# Patient Record
Sex: Female | Born: 1955
Health system: Southern US, Community
[De-identification: ages and names within clinical notes are randomized; demographics above are authoritative.]

## PROBLEM LIST (undated history)

## (undated) DIAGNOSIS — E785 Hyperlipidemia, unspecified: Secondary | ICD-10-CM

## (undated) DIAGNOSIS — I4891 Unspecified atrial fibrillation: Secondary | ICD-10-CM

## (undated) DIAGNOSIS — Z9889 Other specified postprocedural states: Secondary | ICD-10-CM

## (undated) DIAGNOSIS — R112 Nausea with vomiting, unspecified: Secondary | ICD-10-CM

## (undated) HISTORY — PX: WRIST FRACTURE SURGERY: SHX121

## (undated) HISTORY — PX: ABDOMINAL HYSTERECTOMY: SHX81

## (undated) HISTORY — DX: Unspecified atrial fibrillation: I48.91

---
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
## 2015-10-30 DIAGNOSIS — H524 Presbyopia: Secondary | ICD-10-CM | POA: Diagnosis not present

## 2016-06-05 DIAGNOSIS — M654 Radial styloid tenosynovitis [de Quervain]: Secondary | ICD-10-CM | POA: Diagnosis not present

## 2016-07-10 DIAGNOSIS — M654 Radial styloid tenosynovitis [de Quervain]: Secondary | ICD-10-CM | POA: Diagnosis not present

## 2016-08-06 DIAGNOSIS — Z0001 Encounter for general adult medical examination with abnormal findings: Secondary | ICD-10-CM | POA: Diagnosis not present

## 2016-08-06 DIAGNOSIS — Z23 Encounter for immunization: Secondary | ICD-10-CM | POA: Diagnosis not present

## 2016-08-06 DIAGNOSIS — Z1389 Encounter for screening for other disorder: Secondary | ICD-10-CM | POA: Diagnosis not present

## 2016-08-06 DIAGNOSIS — Z1211 Encounter for screening for malignant neoplasm of colon: Secondary | ICD-10-CM | POA: Diagnosis not present

## 2016-08-28 DIAGNOSIS — Z0001 Encounter for general adult medical examination with abnormal findings: Secondary | ICD-10-CM | POA: Diagnosis not present

## 2016-09-04 DIAGNOSIS — M654 Radial styloid tenosynovitis [de Quervain]: Secondary | ICD-10-CM | POA: Diagnosis not present

## 2016-09-12 DIAGNOSIS — Z1231 Encounter for screening mammogram for malignant neoplasm of breast: Secondary | ICD-10-CM | POA: Diagnosis not present

## 2016-09-18 DIAGNOSIS — Z1211 Encounter for screening for malignant neoplasm of colon: Secondary | ICD-10-CM | POA: Diagnosis not present

## 2016-09-18 DIAGNOSIS — Z01818 Encounter for other preprocedural examination: Secondary | ICD-10-CM | POA: Diagnosis not present

## 2016-09-25 DIAGNOSIS — R928 Other abnormal and inconclusive findings on diagnostic imaging of breast: Secondary | ICD-10-CM | POA: Diagnosis not present

## 2016-09-25 DIAGNOSIS — R922 Inconclusive mammogram: Secondary | ICD-10-CM | POA: Diagnosis not present

## 2016-09-25 DIAGNOSIS — D241 Benign neoplasm of right breast: Secondary | ICD-10-CM | POA: Diagnosis not present

## 2016-09-25 DIAGNOSIS — N6489 Other specified disorders of breast: Secondary | ICD-10-CM | POA: Diagnosis not present

## 2016-09-26 DIAGNOSIS — M654 Radial styloid tenosynovitis [de Quervain]: Secondary | ICD-10-CM | POA: Diagnosis not present

## 2016-11-13 DIAGNOSIS — Z90710 Acquired absence of both cervix and uterus: Secondary | ICD-10-CM | POA: Diagnosis not present

## 2016-12-15 DIAGNOSIS — Z1211 Encounter for screening for malignant neoplasm of colon: Secondary | ICD-10-CM | POA: Diagnosis not present

## 2016-12-15 DIAGNOSIS — K648 Other hemorrhoids: Secondary | ICD-10-CM | POA: Diagnosis not present

## 2016-12-15 DIAGNOSIS — E7800 Pure hypercholesterolemia, unspecified: Secondary | ICD-10-CM | POA: Diagnosis not present

## 2016-12-15 DIAGNOSIS — Q438 Other specified congenital malformations of intestine: Secondary | ICD-10-CM | POA: Diagnosis not present

## 2016-12-15 DIAGNOSIS — K5730 Diverticulosis of large intestine without perforation or abscess without bleeding: Secondary | ICD-10-CM | POA: Diagnosis not present

## 2016-12-15 DIAGNOSIS — E785 Hyperlipidemia, unspecified: Secondary | ICD-10-CM | POA: Diagnosis not present

## 2017-01-21 ENCOUNTER — Encounter: Payer: Self-pay | Admitting: Cardiology

## 2017-01-21 DIAGNOSIS — R002 Palpitations: Secondary | ICD-10-CM | POA: Diagnosis not present

## 2017-02-13 ENCOUNTER — Ambulatory Visit: Payer: Self-pay | Admitting: Cardiology

## 2017-02-16 ENCOUNTER — Encounter: Payer: Self-pay | Admitting: Cardiology

## 2017-02-16 ENCOUNTER — Ambulatory Visit (INDEPENDENT_AMBULATORY_CARE_PROVIDER_SITE_OTHER): Payer: BLUE CROSS/BLUE SHIELD | Admitting: Cardiology

## 2017-02-16 DIAGNOSIS — R002 Palpitations: Secondary | ICD-10-CM

## 2017-02-16 NOTE — Patient Instructions (Addendum)
Medication Instructions:  Your physician recommends that you continue on your current medications as directed. Please refer to the Current Medication list given to you today.   Labwork: None  Testing/Procedures: Your physician has requested that you have a stress echocardiogram. For further information please visit [email or web address]. Please follow instruction sheet as given.  Your physician has recommended that you wear an event monitor. Event monitors are medical devices that record the heart's electrical activity. Doctors most often [place] these monitors to diagnose arrhythmias. Arrhythmias are problems with the speed or rhythm of the heartbeat. The monitor is a small, portable device. You can wear one while you do your normal daily activities. This is usually used to diagnose what is causing palpitations/syncope (passing out).    Follow-Up: Your physician recommends that you schedule a follow-up appointment in: 6 weeks.   Any Other Special Instructions Will Be Listed Below (If Applicable).     If you need a refill on your cardiac medications before your next appointment, please call your pharmacy.    [email or web address] [email or web address]        FDA-cleared, clinical grade mobile EKG monitor: Jodelle Red is the most clinically-validated mobile EKG used by the world's leading cardiac care medical professionals With Basic service, know instantly if your heart rhythm is normal or if atrial fibrillation is detected, and email the last single EKG recording to yourself or your doctor Premium service, available for purchase through the Kardia app for $9.99 per month or $99 per year, includes unlimited history and storage of your EKG recordings, a monthly EKG summary report to share with your doctor, along with the ability to track your blood pressure, activity and weight Includes one KardiaMobile phone clip FREE SHIPPING: Standard delivery 1-3 business days. Orders  placed by 11:00am PST will ship that afternoon. Otherwise, will ship next business day. All orders ship via [employer] from [place], [place], the new wearable EKG by [employer]. Vladimir Faster replaces your original Apple Watch band. The first of its kind, FDA-cleared KardiaBand provides accurate and instant analysis for detecting atrial fibrillation (AF) and normal sinus rhythm in an EKG. Simply place your thumb on the integrated KardiaBand sensor to take a medical-grade EKG in just 30 seconds. Results appear instantly on your Apple Watch. Vladimir Faster is available today for just $199. KardiaBand features are designed exclusively for use with [employer]  - $99 year. The [employer] for [employer] includes [employer]'s revolutionary SmartRhythm monitoring feature. SmartRhythm monitoring uses an intelligent neural network that runs directly on the [employer], constantly acquiring data from the watch's heart rate sensor and its accelerometer. SmartRhythm compares your heart rate to what it expects from your minute-by-minute level of activity. When the network sees a pattern of heart rate and activity that it does not expect, it notifies you to take an EKG. With Baring, peace of mind is just an EKG away.  .  1. Avoid all over-the-counter antihistamines except Claritin/Loratadine and Zyrtec/Cetrizine. 2. Avoid all combination including cold sinus allergies flu decongestant and sleep medications 3. You can use Robitussin DM Mucinex and Mucinex DM for cough. 4. can use Tylenol aspirin ibuprofen and naproxen but no combinations such as sleep or sinus.

## 2017-02-16 NOTE — Progress Notes (Signed)
Cardiology Office Note:    Date:  02/16/2017   ID:  Connie Norris, Connie Norris 1955/10/25, MRN [id number]  PCP:  [name], MD  Cardiologist:  Shirlee More, MD   Referring MD: [name], MD  ASSESSMENT:    1. Palpitation    PLAN:    In order of problems listed above:  1. Her symptoms are quite suggestive of paroxysmal atrial fibrillation. Fortunately her risk score if atrial fibrillation is documented as low at 1 and at this time I would not start her on an anticoagulant. I asked her to utilize the event monitor for a month to try to document an episode and if we do not succeed to use a Smart phone adapter to record EKG. She also undergo a stress test to exclude cardiomyopathy or coronary disease. She is given instructions to avoid over-the-counter proarrhythmic drugs.  Next appointment 6 weeks   Medication Adjustments/Labs and Tests Ordered: Current medicines are reviewed at length with the patient today.  Concerns regarding medicines are outlined above.  Orders Placed This Encounter  Procedures  . Cardiac event monitor  . ECHOCARDIOGRAM STRESS TEST   No orders of the defined types were placed in this encounter.    Chief Complaint  Patient presents with  . Palpitations    History of Present Illness:    Connie Norris is a 61 y.o. female who is being seen today for the evaluation of Episodes of profound weakness shortness of breath palpitation at the request of [name], MD. First episode was low but more than a year ago lasted 20 minutes resolved and had no recurrence until June of this year. In June she had 3 episodes.There are nonexertional in nature characterized by her heart skipping shortness of breath weakness but no chest pain TIA or syncope. She takes no over-the-counter proarrhythmic drugs. Unfortunately she has not checked her pulse or blood pressure during an episode. Otherwise she is vigorous active [place] tennis almost daily has no exercise intolerance  and exertional shortness of breath or chest pain. She was seen in Dr. Florina Ou office routine labs were checked EKG performed and advised to be seen in cardiology consultation. She has no history of congenital or rheumatic heart disease  History reviewed. No pertinent past medical history.  Past Surgical History:  Procedure Laterality Date  . ABDOMINAL HYSTERECTOMY    . CESAREAN SECTION    . WRIST FRACTURE SURGERY      Current Medications: Current Meds  Medication Sig  . TURMERIC PO Take 1 capsule by mouth daily.     Allergies:   Penicillins   Social History   Social History  . Marital status: Married    Spouse name: N/A  . Number of children: N/A  . Years of education: N/A   Social History Main Topics  . Smoking status: Former Research scientist (life sciences)  . Smokeless tobacco: Never Used  . Alcohol use Yes  . Drug use: No  . Sexual activity: Not Asked   Other Topics Concern  . None   Social History Narrative  . None     Family History: The patient's family history includes Cancer in her mother; Heart attack in her father and mother; Heart disease in her maternal grandmother.  ROS:   ROS Please see the history of present illness.     All other systems reviewed and are negative.  EKGs/Labs/Other Studies Reviewed:    The following studies were reviewed today:   EKG:  EKG is  ordered today.  The ekg  ordered today demonstrates Sinus rhythm normal.  Recent Labs: No results found for requested labs within last 8760 hours.  Recent Lipid Panel No results found for: CHOL, TRIG, HDL, CHOLHDL, VLDL, LDLCALC, LDLDIRECT  Physical Exam:    VS:  BP 122/80   Pulse 64   Resp 10   Ht 5\' 5"  (1.651 m)   Wt 150 lb 6.4 oz (68.2 kg)   BMI 25.03 kg/m     Wt Readings from Last 3 Encounters:  02/16/17 150 lb 6.4 oz (68.2 kg)     GEN:  Well nourished, well developed in no acute distress HEENT: Normal NECK: No JVD; No carotid bruits LYMPHATICS: No lymphadenopathy CARDIAC: RRR, no murmurs,  rubs, gallops RESPIRATORY:  Clear to auscultation without rales, wheezing or rhonchi  ABDOMEN: Soft, non-tender, non-distended MUSCULOSKELETAL:  No edema; No deformity  SKIN: Warm and dry NEUROLOGIC:  Alert and oriented x 3 PSYCHIATRIC:  Normal affect     Signed, Shirlee More, MD  02/16/2017 4:48 PM    [facility]

## 2017-02-17 NOTE — Addendum Note (Signed)
Addended by: Warner Mccreedy E on: 02/17/2017 04:00 PM   Modules accepted: Orders

## 2017-02-19 ENCOUNTER — Other Ambulatory Visit: Payer: Self-pay

## 2017-02-19 ENCOUNTER — Telehealth: Payer: Self-pay

## 2017-02-19 DIAGNOSIS — R002 Palpitations: Secondary | ICD-10-CM

## 2017-02-19 NOTE — Telephone Encounter (Signed)
Patient advised that insurance did not approve stress echo. Dr. Bettina Gavia advised change to echocardiogram and treadmill stress test. Patient advised of the change and that we will again send for pre-certification from insurance before scheduling.

## 2017-02-25 DIAGNOSIS — R002 Palpitations: Secondary | ICD-10-CM | POA: Diagnosis not present

## 2017-02-25 DIAGNOSIS — R0789 Other chest pain: Secondary | ICD-10-CM | POA: Diagnosis not present

## 2017-02-26 ENCOUNTER — Telehealth: Payer: Self-pay

## 2017-02-26 NOTE — Telephone Encounter (Signed)
Left message for patient to return call for echo and treadmill stress test results.

## 2017-02-26 NOTE — Telephone Encounter (Signed)
Patient returned call. Advised of normal stress test and echo results. Patient states she stopped the liver supplement and has not had any other symptoms. She would like to cancel her appointment and she will call if she has an other symptoms. Appointment was cancelled. No further questions.

## 2017-04-01 ENCOUNTER — Ambulatory Visit: Payer: BLUE CROSS/BLUE SHIELD | Admitting: Cardiology

## 2017-04-16 DIAGNOSIS — N3001 Acute cystitis with hematuria: Secondary | ICD-10-CM | POA: Diagnosis not present

## 2017-05-07 DIAGNOSIS — M2011 Hallux valgus (acquired), right foot: Secondary | ICD-10-CM | POA: Diagnosis not present

## 2017-05-07 DIAGNOSIS — M21611 Bunion of right foot: Secondary | ICD-10-CM | POA: Diagnosis not present

## 2017-05-13 ENCOUNTER — Other Ambulatory Visit: Payer: Self-pay | Admitting: Orthopedic Surgery

## 2017-05-27 ENCOUNTER — Encounter (HOSPITAL_BASED_OUTPATIENT_CLINIC_OR_DEPARTMENT_OTHER): Payer: Self-pay | Admitting: *Deleted

## 2017-06-04 ENCOUNTER — Ambulatory Visit (HOSPITAL_BASED_OUTPATIENT_CLINIC_OR_DEPARTMENT_OTHER)
Admission: RE | Admit: 2017-06-04 | Discharge: 2017-06-04 | Disposition: A | Discharge status: Home / Self Care | Payer: BLUE CROSS/BLUE SHIELD | Source: Ambulatory Visit | Attending: Orthopedic Surgery | Admitting: Orthopedic Surgery

## 2017-06-04 ENCOUNTER — Ambulatory Visit (HOSPITAL_BASED_OUTPATIENT_CLINIC_OR_DEPARTMENT_OTHER): Payer: BLUE CROSS/BLUE SHIELD | Admitting: Anesthesiology

## 2017-06-04 ENCOUNTER — Encounter (HOSPITAL_BASED_OUTPATIENT_CLINIC_OR_DEPARTMENT_OTHER)
Admission: RE | Disposition: A | Discharge status: Home / Self Care | Payer: Self-pay | Source: Ambulatory Visit | Attending: Orthopedic Surgery

## 2017-06-04 ENCOUNTER — Other Ambulatory Visit: Payer: Self-pay

## 2017-06-04 ENCOUNTER — Encounter (HOSPITAL_BASED_OUTPATIENT_CLINIC_OR_DEPARTMENT_OTHER): Payer: Self-pay | Admitting: *Deleted

## 2017-06-04 DIAGNOSIS — Z87891 Personal history of nicotine dependence: Secondary | ICD-10-CM | POA: Diagnosis not present

## 2017-06-04 DIAGNOSIS — Z8249 Family history of ischemic heart disease and other diseases of the circulatory system: Secondary | ICD-10-CM | POA: Diagnosis not present

## 2017-06-04 DIAGNOSIS — Z9889 Other specified postprocedural states: Secondary | ICD-10-CM

## 2017-06-04 DIAGNOSIS — G8918 Other acute postprocedural pain: Secondary | ICD-10-CM | POA: Diagnosis not present

## 2017-06-04 DIAGNOSIS — M21611 Bunion of right foot: Secondary | ICD-10-CM | POA: Insufficient documentation

## 2017-06-04 DIAGNOSIS — M2011 Hallux valgus (acquired), right foot: Secondary | ICD-10-CM | POA: Diagnosis not present

## 2017-06-04 DIAGNOSIS — Z90710 Acquired absence of both cervix and uterus: Secondary | ICD-10-CM | POA: Insufficient documentation

## 2017-06-04 DIAGNOSIS — Z880 Allergy status to penicillin: Secondary | ICD-10-CM | POA: Insufficient documentation

## 2017-06-04 HISTORY — PX: METATARSAL OSTEOTOMY WITH BUNIONECTOMY: SHX5662

## 2017-06-04 HISTORY — DX: Other specified postprocedural states: Z98.890

## 2017-06-04 HISTORY — DX: Other specified postprocedural states: R11.2

## 2017-06-04 HISTORY — PX: AIKEN OSTEOTOMY: SHX6331

## 2017-06-04 SURGERY — BUNIONECTOMY, WITH METATARSAL OSTEOTOMY
Anesthesia: General | Site: Foot | Laterality: Right

## 2017-06-04 MED ORDER — CEFAZOLIN SODIUM-DEXTROSE 2-4 GM/100ML-% IV SOLN
INTRAVENOUS | Status: AC
  Filled 2017-06-04: qty 100

## 2017-06-04 MED ORDER — ONDANSETRON HCL 4 MG/2ML IJ SOLN: 4.0000 mg | Freq: Once | INTRAMUSCULAR | Status: DC | PRN

## 2017-06-04 MED ORDER — FENTANYL CITRATE (PF) 100 MCG/2ML IJ SOLN
INTRAMUSCULAR | Status: AC
  Filled 2017-06-04: qty 2

## 2017-06-04 MED ORDER — HYDROMORPHONE HCL 1 MG/ML IJ SOLN
INTRAMUSCULAR | Status: AC
  Filled 2017-06-04: qty 0.5

## 2017-06-04 MED ORDER — PROPOFOL 10 MG/ML IV BOLUS
INTRAVENOUS | Status: DC | PRN
  Administered 2017-06-04: 30 mg via INTRAVENOUS
  Administered 2017-06-04: 150 mg via INTRAVENOUS
  Administered 2017-06-04: 20 mg via INTRAVENOUS

## 2017-06-04 MED ORDER — DEXAMETHASONE SODIUM PHOSPHATE 4 MG/ML IJ SOLN
INTRAMUSCULAR | Status: DC | PRN
  Administered 2017-06-04: 10 mg via INTRAVENOUS

## 2017-06-04 MED ORDER — FENTANYL CITRATE (PF) 100 MCG/2ML IJ SOLN
25.0000 ug | INTRAMUSCULAR | Status: DC | PRN
  Administered 2017-06-04 (×2): 50 ug via INTRAVENOUS

## 2017-06-04 MED ORDER — SCOPOLAMINE 1 MG/3DAYS TD PT72
MEDICATED_PATCH | TRANSDERMAL | Status: AC
  Filled 2017-06-04: qty 1

## 2017-06-04 MED ORDER — PROMETHAZINE HCL 25 MG/ML IJ SOLN: 6.2500 mg | INTRAMUSCULAR | Status: DC | PRN

## 2017-06-04 MED ORDER — MEPERIDINE HCL 25 MG/ML IJ SOLN: 6.2500 mg | INTRAMUSCULAR | Status: DC | PRN

## 2017-06-04 MED ORDER — CEFAZOLIN SODIUM-DEXTROSE 2-4 GM/100ML-% IV SOLN
2.0000 g | INTRAVENOUS | Status: AC
  Administered 2017-06-04: 2 g via INTRAVENOUS

## 2017-06-04 MED ORDER — ONDANSETRON HCL 4 MG/2ML IJ SOLN
INTRAMUSCULAR | Status: DC | PRN
  Administered 2017-06-04: 4 mg via INTRAVENOUS

## 2017-06-04 MED ORDER — LIDOCAINE HCL (CARDIAC) 20 MG/ML IV SOLN
INTRAVENOUS | Status: DC | PRN
  Administered 2017-06-04: 6 mg via INTRAVENOUS

## 2017-06-04 MED ORDER — SODIUM CHLORIDE 0.9 % IV SOLN: INTRAVENOUS | Status: DC

## 2017-06-04 MED ORDER — FENTANYL CITRATE (PF) 100 MCG/2ML IJ SOLN
25.0000 ug | INTRAMUSCULAR | Status: DC | PRN
  Administered 2017-06-04: 50 ug via INTRAVENOUS

## 2017-06-04 MED ORDER — DOCUSATE SODIUM 100 MG PO CAPS: 100.0000 mg | ORAL_CAPSULE | Freq: Two times a day (BID) | ORAL | 0 refills | Status: DC

## 2017-06-04 MED ORDER — MIDAZOLAM HCL 2 MG/2ML IJ SOLN
INTRAMUSCULAR | Status: AC
  Filled 2017-06-04: qty 2

## 2017-06-04 MED ORDER — CHLORHEXIDINE GLUCONATE 4 % EX LIQD: 60.0000 mL | Freq: Once | CUTANEOUS | Status: DC

## 2017-06-04 MED ORDER — OXYCODONE HCL 5 MG PO TABS: 5.0000 mg | ORAL_TABLET | ORAL | 0 refills | Status: DC | PRN

## 2017-06-04 MED ORDER — ROPIVACAINE HCL 7.5 MG/ML IJ SOLN
INTRAMUSCULAR | Status: DC | PRN
  Administered 2017-06-04: 20 mL via PERINEURAL

## 2017-06-04 MED ORDER — HYDROMORPHONE HCL 1 MG/ML IJ SOLN
0.5000 mg | INTRAMUSCULAR | Status: DC | PRN
  Administered 2017-06-04 (×3): 0.5 mg via INTRAVENOUS

## 2017-06-04 MED ORDER — SENNA 8.6 MG PO TABS: 2.0000 | ORAL_TABLET | Freq: Two times a day (BID) | ORAL | 0 refills | Status: DC

## 2017-06-04 MED ORDER — LACTATED RINGERS IV SOLN
INTRAVENOUS | Status: DC | PRN
  Administered 2017-06-04: 11:00:00 via INTRAVENOUS

## 2017-06-04 MED ORDER — FENTANYL CITRATE (PF) 100 MCG/2ML IJ SOLN: 100.0000 ug | Freq: Once | INTRAMUSCULAR | Status: DC

## 2017-06-04 MED ORDER — MIDAZOLAM HCL 2 MG/2ML IJ SOLN: 2.0000 mg | Freq: Once | INTRAMUSCULAR | Status: DC

## 2017-06-04 SURGICAL SUPPLY — 71 items
BANDAGE ESMARK 6X9 LF (GAUZE/BANDAGES/DRESSINGS) IMPLANT
BIT DRILL 1.5X30 QC DISP (BIT) ×2 IMPLANT
BIT DRILL 2.0 (BIT) ×1
BIT DRILL 2XNS DISP SS SM FRAG (BIT) ×1 IMPLANT
BIT DRL 2XNS DISP SS SM FRAG (BIT) ×1
BLADE AVERAGE 25X9 (BLADE) ×2 IMPLANT
BLADE LONG MED 25X9 (BLADE) IMPLANT
BLADE MICRO SAGITTAL (BLADE) IMPLANT
BLADE SURG 15 STRL LF DISP TIS (BLADE) ×3 IMPLANT
BLADE SURG 15 STRL SS (BLADE) ×3
BNDG COHESIVE 4X5 TAN STRL (GAUZE/BANDAGES/DRESSINGS) ×2 IMPLANT
BNDG COHESIVE 6X5 TAN STRL LF (GAUZE/BANDAGES/DRESSINGS) IMPLANT
BNDG CONFORM 3 STRL LF (GAUZE/BANDAGES/DRESSINGS) ×2 IMPLANT
BNDG ESMARK 6X9 LF (GAUZE/BANDAGES/DRESSINGS)
CHLORAPREP W/TINT 26ML (MISCELLANEOUS) ×2 IMPLANT
COVER BACK TABLE 60X90IN (DRAPES) ×4 IMPLANT
COVER MAYO STAND STRL (DRAPES) ×2 IMPLANT
CUFF TOURNIQUET SINGLE 24IN (TOURNIQUET CUFF) IMPLANT
CUFF TOURNIQUET SINGLE 34IN LL (TOURNIQUET CUFF) ×2 IMPLANT
DRAPE EXTREMITY T 121X128X90 (DRAPE) ×2 IMPLANT
DRAPE OEC MINIVIEW 54X84 (DRAPES) ×2 IMPLANT
DRAPE U-SHAPE 47X51 STRL (DRAPES) ×2 IMPLANT
DRSG MEPITEL 4X7.2 (GAUZE/BANDAGES/DRESSINGS) ×2 IMPLANT
DRSG PAD ABDOMINAL 8X10 ST (GAUZE/BANDAGES/DRESSINGS) ×2 IMPLANT
ELECT REM PT RETURN 9FT ADLT (ELECTROSURGICAL) ×2
ELECTRODE REM PT RTRN 9FT ADLT (ELECTROSURGICAL) ×1 IMPLANT
GAUZE SPONGE 4X4 12PLY STRL (GAUZE/BANDAGES/DRESSINGS) ×2 IMPLANT
GLOVE BIO SURGEON STRL SZ8 (GLOVE) ×2 IMPLANT
GLOVE BIOGEL PI IND STRL 7.0 (GLOVE) ×1 IMPLANT
GLOVE BIOGEL PI IND STRL 8 (GLOVE) ×2 IMPLANT
GLOVE BIOGEL PI INDICATOR 7.0 (GLOVE) ×1
GLOVE BIOGEL PI INDICATOR 8 (GLOVE) ×2
GLOVE ECLIPSE 6.5 STRL STRAW (GLOVE) ×2 IMPLANT
GLOVE ECLIPSE 8.0 STRL XLNG CF (GLOVE) ×2 IMPLANT
GOWN STRL REUS W/ TWL LRG LVL3 (GOWN DISPOSABLE) ×1 IMPLANT
GOWN STRL REUS W/ TWL XL LVL3 (GOWN DISPOSABLE) ×2 IMPLANT
GOWN STRL REUS W/TWL LRG LVL3 (GOWN DISPOSABLE) ×1
GOWN STRL REUS W/TWL XL LVL3 (GOWN DISPOSABLE) ×2
GUIDEWIRE .08 (WIRE) IMPLANT
K-WIRE .054X4 (WIRE) ×2 IMPLANT
NEEDLE HYPO 25X1 1.5 SAFETY (NEEDLE) IMPLANT
NS IRRIG 1000ML POUR BTL (IV SOLUTION) ×2 IMPLANT
PACK BASIN DAY SURGERY FS (CUSTOM PROCEDURE TRAY) ×2 IMPLANT
PAD CAST 4YDX4 CTTN HI CHSV (CAST SUPPLIES) ×1 IMPLANT
PADDING CAST ABS 4INX4YD NS (CAST SUPPLIES)
PADDING CAST ABS COTTON 4X4 ST (CAST SUPPLIES) IMPLANT
PADDING CAST COTTON 4X4 STRL (CAST SUPPLIES) ×1
PENCIL BUTTON HOLSTER BLD 10FT (ELECTRODE) ×2 IMPLANT
SANITIZER HAND PURELL 535ML FO (MISCELLANEOUS) ×2 IMPLANT
SCREW 2.0 CORTICAL FT 20MM (Screw) ×2 IMPLANT
SCREW 2.0MM CORTICAL FT 28MM (Screw) ×2 IMPLANT
SCREW CORTICAL 2.0X16 (Screw) ×2 IMPLANT
SHEET MEDIUM DRAPE 40X70 STRL (DRAPES) ×2 IMPLANT
SLEEVE SCD COMPRESS KNEE MED (MISCELLANEOUS) ×2 IMPLANT
SPONGE LAP 18X18 X RAY DECT (DISPOSABLE) ×2 IMPLANT
STOCKINETTE 6  STRL (DRAPES) ×1
STOCKINETTE 6 STRL (DRAPES) ×1 IMPLANT
SUCTION FRAZIER HANDLE 10FR (MISCELLANEOUS)
SUCTION TUBE FRAZIER 10FR DISP (MISCELLANEOUS) IMPLANT
SUT ETHIBOND 3-0 V-5 (SUTURE) IMPLANT
SUT ETHILON 3 0 PS 1 (SUTURE) ×4 IMPLANT
SUT MNCRL AB 3-0 PS2 18 (SUTURE) ×2 IMPLANT
SUT VIC AB 0 SH 27 (SUTURE) IMPLANT
SUT VIC AB 2-0 SH 27 (SUTURE) ×1
SUT VIC AB 2-0 SH 27XBRD (SUTURE) ×1 IMPLANT
SUT VICRYL 0 UR6 27IN ABS (SUTURE) IMPLANT
SYR BULB 3OZ (MISCELLANEOUS) ×2 IMPLANT
SYR CONTROL 10ML LL (SYRINGE) IMPLANT
TOWEL OR 17X24 6PK STRL BLUE (TOWEL DISPOSABLE) ×2 IMPLANT
TUBE CONNECTING 20X1/4 (TUBING) IMPLANT
UNDERPAD 30X30 (UNDERPADS AND DIAPERS) IMPLANT

## 2017-06-04 NOTE — Progress Notes (Signed)
Pt c/o pain when placing foot on 2 pillows. Pt kept her eyes shut and hands together in front of her face. Stated pain is 2/10 . Pt later c/o nausea - given peppermint oil - said it helped. Pt just kept eyes shut when responding to questions I asked her.

## 2017-06-04 NOTE — Progress Notes (Signed)
AssistedDr. Oddono with right, ultrasound guided, adductor canal block. Side rails up, monitors on throughout procedure. See vital signs in flow sheet. Tolerated Procedure well.  

## 2017-06-04 NOTE — H&P (Signed)
Connie Norris is an 61 y.o. female.   Chief Complaint: right foot pain HPI:  61 y/o female with chronic right forefoot pain from a bunion deformity.  She has failed non op treatment and presents today for right 1st MT scarf osteotomy and modified McBride bunionectomy and possibly an Akin osteotomy.  Past Medical History:  Diagnosis Date  . PONV (postoperative nausea and vomiting)     Past Surgical History:  Procedure Laterality Date  . ABDOMINAL HYSTERECTOMY    . CESAREAN SECTION    . WRIST FRACTURE SURGERY     1999 left wrist    Family History  Problem Relation Age of Onset  . Cancer Mother   . Heart attack Mother   . Heart attack Father   . Heart disease Maternal Grandmother        Pacemaker   Social History:  reports that she quit smoking about 10 years ago. she has never used smokeless tobacco. She reports that she drinks about 0.6 oz of alcohol per week. She reports that she does not use drugs.  Allergies:  Allergies  Allergen Reactions  . Penicillins     No medications prior to admission.    No results found for this or any previous visit (from the past 48 hour(s)). No results found.  ROS  No recent f/c/n/v/wt loss.  Height 5\' 5"  (1.651 m), weight 68.9 kg (152 lb). Physical Exam  wn wd woman in nad.  A and o x 4.  Mood and affect normal.  EOMI.  resp unlabored.  R foot with moderate bunion.  SKin healthy and intact.  No lymphadenopathy.  5/5 strength in PF and DF of the toes.  Sens to LT intact at the forefoot.  Pulses are palpable.  Assessment/Plan R bunion - to OR for surgical correction. The risks and benefits of the alternative treatment options have been discussed in detail.  The patient wishes to proceed with surgery and specifically understands risks of bleeding, infection, nerve damage, blood clots, need for additional surgery, amputation and death.   [name], MD 2017/06/14, 7:46 AM

## 2017-06-04 NOTE — Anesthesia Preprocedure Evaluation (Signed)
Anesthesia Evaluation  Patient identified by MRN, date of birth, ID band Patient awake    Reviewed: Allergy & Precautions, NPO status , Patient's Chart, lab work & pertinent test results  History of Anesthesia Complications (+) PONV and history of anesthetic complications  Airway Mallampati: II  TM Distance: >3 FB Neck ROM: Full    Dental  (+) Teeth Intact, Dental Advisory Given   Pulmonary former smoker,    Pulmonary exam normal breath sounds clear to auscultation       Cardiovascular negative cardio ROS Normal cardiovascular exam Rhythm:Regular Rate:Normal     Neuro/Psych negative neurological ROS  negative psych ROS   GI/Hepatic negative GI ROS, Neg liver ROS,   Endo/Other  negative endocrine ROS  Renal/GU negative Renal ROS     Musculoskeletal negative musculoskeletal ROS (+)   Abdominal   Peds  Hematology negative hematology ROS (+)   Anesthesia Other Findings Day of surgery medications reviewed with the patient.  Reproductive/Obstetrics                             Anesthesia Physical Anesthesia Plan  ASA: I  Anesthesia Plan: General   Post-op Pain Management:  Regional for Post-op pain   Induction: Intravenous  PONV Risk Score and Plan: 4 or greater and Scopolamine patch - Pre-op, Midazolam, Dexamethasone and Ondansetron  Airway Management Planned: LMA  Additional Equipment:   Intra-op Plan:   Post-operative Plan: Extubation in OR  Informed Consent: I have reviewed the patients History and Physical, chart, labs and discussed the procedure including the risks, benefits and alternatives for the proposed anesthesia with the patient or authorized representative who has indicated his/her understanding and acceptance.   Dental advisory given  Plan Discussed with: CRNA  Anesthesia Plan Comments: (Risks/benefits of general anesthesia discussed with patient including risk  of damage to teeth, lips, gum, and tongue, nausea/vomiting, allergic reactions to medications, and the possibility of heart attack, stroke and death.  All patient questions answered.  Patient wishes to proceed.)        Anesthesia Quick Evaluation

## 2017-06-04 NOTE — Transfer of Care (Signed)
Immediate Anesthesia Transfer of Care Note  Patient: [name]  Procedure(s) Performed: Right 1st metatarsal scarf osteotomy, modified McBride bunionectomy and  Akin osteotomy (Right Foot) AIKEN OSTEOTOMY (Right Foot)  Patient Location: PACU  Anesthesia Type:GA combined with regional for post-op pain  Level of Consciousness: awake, sedated and patient cooperative  Airway & Oxygen Therapy: Patient Spontanous Breathing and Patient connected to face mask oxygen  Post-op Assessment: Report given to RN and Post -op Vital signs reviewed and stable  Post vital signs: Reviewed and stable  Last Vitals:  Vitals:   06/04/17 1029 06/04/17 1030  BP:  108/65  Pulse: (!) 59 60  Resp: 13 16  Temp:    SpO2: 99% 98%    Last Pain:  Vitals:   06/04/17 0949  TempSrc: Oral         Complications: No apparent anesthesia complications

## 2017-06-04 NOTE — Anesthesia Procedure Notes (Signed)
Anesthesia Regional Block: Adductor canal block   Pre-Anesthetic Checklist: ,, timeout performed, Correct Patient, Correct Site, Correct Laterality, Correct Procedure, Correct Position, site marked, Risks and benefits discussed,  Surgical consent,  Pre-op evaluation,  At surgeon's request and post-op pain management  Laterality: Right  Prep: chloraprep       Needles:  Injection technique: Single-shot  Needle Type: Echogenic Stimulator Needle     Needle Length: 5cm  Needle Gauge: 22     Additional Needles:   Procedures:, nerve stimulator,,, ultrasound used (permanent image in chart),,,,  Narrative:  Start time: 06/04/2017 10:15 AM End time: 06/04/2017 10:20 AM Injection made incrementally with aspirations every 5 mL.  Performed by: Personally  Anesthesiologist: Janeece Riggers, MD  Additional Notes: Functioning IV was confirmed and monitors were applied.  A 80mm 22ga Arrow echogenic stimulator needle was used. Sterile prep and drape,hand hygiene and sterile gloves were used. Ultrasound guidance: relevant anatomy identified, needle position confirmed, local anesthetic spread visualized around nerve(s)., vascular puncture avoided.  Image printed for medical record. Negative aspiration and negative test dose prior to incremental administration of local anesthetic. The patient tolerated the procedure well.

## 2017-06-04 NOTE — Anesthesia Postprocedure Evaluation (Signed)
Anesthesia Post Note  Patient: Connie Norris  Procedure(s) Performed: Right 1st metatarsal scarf osteotomy, modified McBride bunionectomy and  Akin osteotomy (Right Foot) AIKEN OSTEOTOMY (Right Foot)     Patient location during evaluation: PACU Anesthesia Type: General Level of consciousness: awake and alert Pain management: pain level controlled Vital Signs Assessment: post-procedure vital signs reviewed and stable Respiratory status: spontaneous breathing, nonlabored ventilation, respiratory function stable and patient connected to nasal cannula oxygen Cardiovascular status: blood pressure returned to baseline and stable Postop Assessment: no apparent nausea or vomiting Anesthetic complications: no    Last Vitals:  Vitals:   06/04/17 1215 06/04/17 1230  BP: (!) 156/96 (!) 147/73  Pulse: 75 70  Resp: 16 13  Temp:    SpO2: 100% 97%    Last Pain:  Vitals:   06/04/17 1215  TempSrc:   PainSc: 10-Worst pain ever                 Johndaniel Catlin

## 2017-06-04 NOTE — Anesthesia Procedure Notes (Signed)
Procedure Name: LMA Insertion Date/Time: 06/04/2017 10:58 AM Performed by: Lyndee Leo, CRNA Pre-anesthesia Checklist: Patient identified, Emergency Drugs available, Suction available and Patient being monitored Patient Re-evaluated:Patient Re-evaluated prior to induction Oxygen Delivery Method: Circle system utilized Preoxygenation: Pre-oxygenation with 100% oxygen Induction Type: IV induction Ventilation: Mask ventilation without difficulty LMA: LMA inserted LMA Size: 4.0 Number of attempts: 1 Airway Equipment and Method: Bite block Placement Confirmation: positive ETCO2 Tube secured with: Tape Dental Injury: Teeth and Oropharynx as per pre-operative assessment

## 2017-06-04 NOTE — Op Note (Signed)
06/04/2017  11:52 AM  PATIENT:  Connie Norris  61 y.o. female  PRE-OPERATIVE DIAGNOSIS:  Right bunion  POST-OPERATIVE DIAGNOSIS:  Right bunion  Procedure(s): 1.  Right modified McBride bunionectomy   2.  Right 1st MT scarf osteotomy   3.  Right hallux proximal phalanx Akin osteotomy   4.  Right foot AP and lateral xrays  SURGEON:  [name], MD  ASSISTANT: Mechele Claude, PA-C  ANESTHESIA:   General, regional  EBL:  minimal   TOURNIQUET:  42 min at 627 mm Hg  COMPLICATIONS:  None apparent  DISPOSITION:  Extubated, awake and stable to recovery.  INDICATION FOR PROCEDURE: The patient is a 61 year old woman who has a long history of a painful right foot bunion deformity.  She has failed nonoperative treatment to date including activity modification, oral anti-inflammatories and shoewear modification.  She presents today for operative treatment of this painful deformity.  The risks and benefits of the alternative treatment options have been discussed in detail.  The patient wishes to proceed with surgery and specifically understands risks of bleeding, infection, nerve damage, blood clots, need for additional surgery, amputation and death.  PROCEDURE IN DETAIL:  After pre operative consent was obtained, and the correct operative site was identified, the patient was brought to the operating room and placed supine on the OR table.  Anesthesia was administered.  Pre-operative antibiotics were administered.  A surgical timeout was taken.  The right lower extremity was prepped and draped in standard sterile fashion with a tourniquet around the thigh.  The extremity was exsanguinated and the tourniquet was inflated to 250 mmHg.  A longitudinal incision was made over the dorsum of the first webspace.  Dissection was carried down through the subtendinous tissues.  The intermetatarsal ligament was divided under direct vision.  An arthrotomy was then made between the lateral sesamoid and the  metatarsal head.  Small perforations were made in the lateral joint capsule.  The hallux could then be positioned in 20 degrees of varus passively.  Attention was then turned to the medial forefoot where a longitudinal incision was made over the medial eminence.  Dissection was carried down through the subtendinous tissues in the medial joint capsule was incised and elevated plantarly and dorsally.  Subperiosteal dissection was then carried along the first metatarsal medially.  The medial eminence was then resected in line with the first metatarsal shaft.  A scarf osteotomy was then cut in the first metatarsal shaft after marking the corners with K wires.  A small wedge of bone was removed proximally.  The metatarsal head was translated laterally to correct the intermetatarsal and hallux valgus angles.  The osteotomy was held with a tenaculum.  The 2 mm screws were then inserted into the head and into the shaft proximally in bicortical fashion.  AP and lateral radiographs confirmed appropriate correction of the hallux valgus and intermetatarsal angles.  The overhanging bone was then trimmed medially with the oscillating saw.  The patient was noted to have a bit of residual hallux valgus interphalangeus.  The decision was made to proceed with an [facility] osteotomy of the proximal phalanx.  Subperiosteal dissection was carried plantarly and dorsally.  A closing wedge osteotomy was then cut in the base of the proximal phalanx.  The osteotomy was closed down and fixed with a 2 mm screw inserted in lag fashion.  Final AP and lateral radiographs confirmed appropriate position and length of all hardware in appropriate correction of the hallux valgus and intermetatarsal  angles.  The wound was irrigated copiously.  Medial joint capsule was then repaired with number cutting sutures of 2-0 Vicryl.  Subtendinous tissues were approximated with 3-0 Monocryl.  Skin incision was closed with 3-0 nylon.  Sterile dressings were  applied followed by a bunion wrap.  Tourniquet was released after application of the dressings at 42 minutes.  The patient was awakened from anesthesia and transported to the recovery room in stable condition.   FOLLOW UP PLAN: Weightbearing as tolerated on the heel in a Darco wedge style shoe.  Follow-up with me in the office in 2 weeks for suture removal and conversion to a toe spacer.   RADIOGRAPHS: AP and lateral radiographs of the right foot show interval osteotomy of the first metatarsal and proximal phalanx of the hallux.  Hardware is appropriately positioned and of the appropriate lengths.  Intermetatarsal and hallux valgus angles have been appropriately corrected.    Mechele Claude PA-C was present and scrubbed for the duration of the operative case. His assistance assistance was essential in positioning the patient, prepping and draping, gaining maintaining exposure, performing the operation, closing and dressing the wounds and applying the splint.

## 2017-06-04 NOTE — Discharge Instructions (Addendum)
Connie Simmer, MD [facility]  Please read the following information regarding your care after surgery.  Medications  You only need a prescription for the narcotic pain medicine (ex. oxycodone, Percocet, Norco).  All of the other medicines listed below are available over the counter. X Aleve 2 pills twice a day for the first 3 days after surgery. X acetominophen (Tylenol) 650 mg every 4-6 hours as you need for minor to moderate pain X oxycodone as prescribed for severe pain  Narcotic pain medicine (ex. oxycodone, Percocet, Vicodin) will cause constipation.  To prevent this problem, take the following medicines while you are taking any pain medicine. X docusate sodium (Colace) 100 mg twice a day X senna (Senokot) 2 tablets twice a day     Post Anesthesia Home Care Instructions  Activity: Get plenty of rest for the remainder of the day. A responsible individual must stay with you for 24 hours following the procedure.  For the next 24 hours, DO NOT: -Drive a car -Paediatric nurse -Drink alcoholic beverages -Take any medication unless instructed by your physician -Make any legal decisions or sign important papers.  Meals: Start with liquid foods such as gelatin or soup. Progress to regular foods as tolerated. Avoid greasy, spicy, heavy foods. If nausea and/or vomiting occur, drink only clear liquids until the nausea and/or vomiting subsides. Call your physician if vomiting continues.  Special Instructions/Symptoms: Your throat may feel dry or sore from the anesthesia or the breathing tube placed in your throat during surgery. If this causes discomfort, gargle with warm salt water. The discomfort should disappear within 24 hours.  If you had a scopolamine patch placed behind your ear for the management of post- operative nausea and/or vomiting:  1. The medication in the patch is effective for 72 hours, after which it should be removed.  Wrap patch in a tissue and discard in  the trash. Wash hands thoroughly with soap and water. 2. You may remove the patch earlier than 72 hours if you experience unpleasant side effects which may include dry mouth, dizziness or visual disturbances. 3. Avoid touching the patch. Wash your hands with soap and water after contact with the patch.      Weight Bearing X Bear weight only on your operated foot in the post-op shoe on your heel.   Cast / Splint / Dressing X Keep your splint, cast or dressing clean and dry.  Dont put anything (coat hanger, pencil, etc) down inside of it.  If it gets damp, use a hair dryer on the cool setting to dry it.  If it gets soaked, call the office to schedule an appointment for a cast/dressing change.  After your dressing, cast or splint is removed; you may shower, but do not soak or scrub the wound.  Allow the water to run over it, and then gently pat it dry.  Swelling It is normal for you to have swelling where you had surgery.  To reduce swelling and pain, keep your toes above your nose for at least 3 days after surgery.  It may be necessary to keep your foot or leg elevated for several weeks.  If it hurts, it should be elevated.  Follow Up Call my office at [phone] when you are discharged from the hospital or surgery center to schedule an appointment to be seen two weeks after surgery.  Call my office at [phone] if you develop a fever >101.5 F, nausea, vomiting, bleeding from the surgical site or severe pain.  Regional Anesthesia Blocks  1. Numbness or the inability to move the "blocked" extremity may last from 3-48 hours after placement. The length of time depends on the medication injected and your individual response to the medication. If the numbness is not going away after 48 hours, call your surgeon.  2. The extremity that is blocked will need to be protected until the numbness is gone and the  Strength has returned. Because you cannot feel it, you will need to take extra  care to avoid injury. Because it may be weak, you may have difficulty moving it or using it. You may not know what position it is in without looking at it while the block is in effect.  3. For blocks in the legs and feet, returning to weight bearing and walking needs to be done carefully. You will need to wait until the numbness is entirely gone and the strength has returned. You should be able to move your leg and foot normally before you try and bear weight or walk. You will need someone to be with you when you first try to ensure you do not fall and possibly risk injury.  4. Bruising and tenderness at the needle site are common side effects and will resolve in a few days.  5. Persistent numbness or new problems with movement should be communicated to the surgeon or the [facility] [phone] [place] [phone]).

## 2017-06-05 ENCOUNTER — Encounter (HOSPITAL_BASED_OUTPATIENT_CLINIC_OR_DEPARTMENT_OTHER): Payer: Self-pay | Admitting: Orthopedic Surgery

## 2017-06-08 DIAGNOSIS — M2011 Hallux valgus (acquired), right foot: Secondary | ICD-10-CM | POA: Diagnosis not present

## 2017-06-09 ENCOUNTER — Encounter (HOSPITAL_BASED_OUTPATIENT_CLINIC_OR_DEPARTMENT_OTHER): Payer: Self-pay | Admitting: Orthopedic Surgery

## 2017-07-22 DIAGNOSIS — M79671 Pain in right foot: Secondary | ICD-10-CM | POA: Diagnosis not present

## 2017-07-27 DIAGNOSIS — M2010 Hallux valgus (acquired), unspecified foot: Secondary | ICD-10-CM | POA: Insufficient documentation

## 2017-08-10 DIAGNOSIS — J111 Influenza due to unidentified influenza virus with other respiratory manifestations: Secondary | ICD-10-CM | POA: Diagnosis not present

## 2017-08-19 DIAGNOSIS — Z0001 Encounter for general adult medical examination with abnormal findings: Secondary | ICD-10-CM | POA: Diagnosis not present

## 2017-08-19 DIAGNOSIS — N952 Postmenopausal atrophic vaginitis: Secondary | ICD-10-CM | POA: Diagnosis not present

## 2017-08-19 DIAGNOSIS — Z1389 Encounter for screening for other disorder: Secondary | ICD-10-CM | POA: Diagnosis not present

## 2017-08-19 DIAGNOSIS — Z90710 Acquired absence of both cervix and uterus: Secondary | ICD-10-CM | POA: Diagnosis not present

## 2017-08-19 DIAGNOSIS — E7800 Pure hypercholesterolemia, unspecified: Secondary | ICD-10-CM | POA: Diagnosis not present

## 2017-08-19 DIAGNOSIS — Z0000 Encounter for general adult medical examination without abnormal findings: Secondary | ICD-10-CM | POA: Diagnosis not present

## 2017-08-20 DIAGNOSIS — M79671 Pain in right foot: Secondary | ICD-10-CM | POA: Diagnosis not present

## 2017-09-15 DIAGNOSIS — Z1231 Encounter for screening mammogram for malignant neoplasm of breast: Secondary | ICD-10-CM | POA: Diagnosis not present

## 2017-10-07 DIAGNOSIS — R928 Other abnormal and inconclusive findings on diagnostic imaging of breast: Secondary | ICD-10-CM | POA: Diagnosis not present

## 2017-11-05 DIAGNOSIS — H25813 Combined forms of age-related cataract, bilateral: Secondary | ICD-10-CM | POA: Diagnosis not present

## 2018-06-08 DIAGNOSIS — R05 Cough: Secondary | ICD-10-CM | POA: Diagnosis not present

## 2018-09-02 DIAGNOSIS — Z23 Encounter for immunization: Secondary | ICD-10-CM | POA: Diagnosis not present

## 2018-09-02 DIAGNOSIS — Z90710 Acquired absence of both cervix and uterus: Secondary | ICD-10-CM | POA: Diagnosis not present

## 2018-09-02 DIAGNOSIS — Z1389 Encounter for screening for other disorder: Secondary | ICD-10-CM | POA: Diagnosis not present

## 2018-09-02 DIAGNOSIS — Z0000 Encounter for general adult medical examination without abnormal findings: Secondary | ICD-10-CM | POA: Diagnosis not present

## 2018-11-10 DIAGNOSIS — M7541 Impingement syndrome of right shoulder: Secondary | ICD-10-CM | POA: Diagnosis not present

## 2018-11-10 DIAGNOSIS — M25511 Pain in right shoulder: Secondary | ICD-10-CM | POA: Diagnosis not present

## 2018-11-22 DIAGNOSIS — Z23 Encounter for immunization: Secondary | ICD-10-CM | POA: Diagnosis not present

## 2018-12-13 ENCOUNTER — Telehealth: Payer: Self-pay | Admitting: Cardiology

## 2018-12-13 NOTE — Telephone Encounter (Signed)
Virtual Visit Pre-Appointment Phone Call  "(Name), I am calling you today to discuss your upcoming appointment. We are currently trying to limit exposure to the virus that causes COVID-19 by seeing patients at home rather than in the office."  1. "What is the BEST phone number to call the day of the visit?" - include this in appointment notes  2. Do you have or have access to (through a family member/friend) a smartphone with video capability that we can use for your visit?" a. If yes - list this number in appt notes as cell (if different from BEST phone #) and list the appointment type as a VIDEO visit in appointment notes b. If no - list the appointment type as a PHONE visit in appointment notes  3. Confirm consent - "In the setting of the current Covid19 crisis, you are scheduled for a (phone or video) visit with your provider on (date) at (time).  Just as we do with many in-office visits, in order for you to participate in this visit, we must obtain consent.  If you'd like, I can send this to your mychart (if signed up) or email for you to review.  Otherwise, I can obtain your verbal consent now.  All virtual visits are billed to your insurance company just like a normal visit would be.  By agreeing to a virtual visit, we'd like you to understand that the technology does not allow for your provider to perform an examination, and thus may limit your provider's ability to fully assess your condition. If your provider identifies any concerns that need to be evaluated in person, we will make arrangements to do so.  Finally, though the technology is pretty good, we cannot assure that it will always work on either your or our end, and in the setting of a video visit, we may have to convert it to a phone-only visit.  In either situation, we cannot ensure that we have a secure connection.  Are you willing to proceed?" STAFF: Did the patient verbally acknowledge consent to telehealth visit? Document  YES/NO here: Yes  4. Advise patient to be prepared - "Two hours prior to your appointment, go ahead and check your blood pressure, pulse, oxygen saturation, and your weight (if you have the equipment to check those) and write them all down. When your visit starts, your provider will ask you for this information. If you have an Apple Watch or Kardia device, please plan to have heart rate information ready on the day of your appointment. Please have a pen and paper handy nearby the day of the visit as well."  5. Give patient instructions for MyChart download to smartphone OR Doximity/Doxy.me as below if video visit (depending on what platform provider is using)  6. Inform patient they will receive a phone call 15 minutes prior to their appointment time (may be from unknown caller ID) so they should be prepared to answer    TELEPHONE CALL NOTE  Connie Norris has been deemed a candidate for a follow-up tele-health visit to limit community exposure during the Covid-19 pandemic. I spoke with the patient via phone to ensure availability of phone/video source, confirm preferred email & phone number, and discuss instructions and expectations.  I reminded Connie Norris to be prepared with any vital sign and/or heart rhythm information that could potentially be obtained via home monitoring, at the time of her visit. I reminded Connie Norris to expect a phone call prior to her visit.  Calla Kicks 12/13/2018 4:33 PM   INSTRUCTIONS FOR DOWNLOADING THE MYCHART APP TO SMARTPHONE  - The patient must first make sure to have activated MyChart and know their login information - If Apple, go to [employer] and type in MyChart in the search bar and download the app. If Android, ask patient to go to [employer] and type in [place] in the search bar and download the app. The app is free but as with any other app downloads, their phone may require them to verify saved payment information or Apple/Android  password.  - The patient will need to then log into the app with their MyChart username and password, and select [facility] as their healthcare provider to link the account. When it is time for your visit, go to the MyChart app, find appointments, and click Begin Video Visit. Be sure to Select Allow for your device to access the Microphone and Camera for your visit. You will then be connected, and your provider will be with you shortly.  **If they have any issues connecting, or need assistance please contact MyChart service desk [phone])**  **If using a computer, in order to ensure the best quality for their visit they will need to use either of the following Internet Browsers: [employer], or Google Chrome**  IF USING DOXIMITY or DOXY.ME - The patient will receive a link just prior to their visit by text.     FULL LENGTH CONSENT FOR TELE-HEALTH VISIT   I hereby voluntarily request, consent and authorize [facility] and its employed or contracted physicians, physician assistants, nurse practitioners or other licensed health care professionals (the Practitioner), to provide me with telemedicine health care services (the Services") as deemed necessary by the treating Practitioner. I acknowledge and consent to receive the Services by the Practitioner via telemedicine. I understand that the telemedicine visit will involve communicating with the Practitioner through live audiovisual communication technology and the disclosure of certain medical information by electronic transmission. I acknowledge that I have been given the opportunity to request an in-person assessment or other available alternative prior to the telemedicine visit and am voluntarily participating in the telemedicine visit.  I understand that I have the right to withhold or withdraw my consent to the use of telemedicine in the course of my care at any time, without affecting my right to future care or treatment,  and that the Practitioner or I may terminate the telemedicine visit at any time. I understand that I have the right to inspect all information obtained and/or recorded in the course of the telemedicine visit and may receive copies of available information for a reasonable fee.  I understand that some of the potential risks of receiving the Services via telemedicine include:   Delay or interruption in medical evaluation due to technological equipment failure or disruption;  Information transmitted may not be sufficient (e.g. poor resolution of images) to allow for appropriate medical decision making by the Practitioner; and/or   In rare instances, security protocols could fail, causing a breach of personal health information.  Furthermore, I acknowledge that it is my responsibility to provide information about my medical history, conditions and care that is complete and accurate to the best of my ability. I acknowledge that Practitioner's advice, recommendations, and/or decision may be based on factors not within their control, such as incomplete or inaccurate data provided by me or distortions of diagnostic images or specimens that may result from electronic transmissions. I understand that the  practice of medicine is not an Chief Strategy Officer and that Practitioner makes no warranties or guarantees regarding treatment outcomes. I acknowledge that I will receive a copy of this consent concurrently upon execution via email to the email address I last provided but may also request a printed copy by calling the office of [facility].    I understand that my insurance will be billed for this visit.   I have read or had this consent read to me.  I understand the contents of this consent, which adequately explains the benefits and risks of the Services being provided via telemedicine.   I have been provided ample opportunity to ask questions regarding this consent and the Services and have had my questions  answered to my satisfaction.  I give my informed consent for the services to be provided through the use of telemedicine in my medical care  By participating in this telemedicine visit I agree to the above.

## 2018-12-14 ENCOUNTER — Encounter: Payer: Self-pay | Admitting: Cardiology

## 2018-12-14 ENCOUNTER — Encounter: Payer: Self-pay | Admitting: *Deleted

## 2018-12-14 DIAGNOSIS — E785 Hyperlipidemia, unspecified: Secondary | ICD-10-CM | POA: Insufficient documentation

## 2018-12-14 DIAGNOSIS — Z8679 Personal history of other diseases of the circulatory system: Secondary | ICD-10-CM | POA: Insufficient documentation

## 2018-12-14 HISTORY — DX: Hyperlipidemia, unspecified: E78.5

## 2018-12-14 NOTE — Progress Notes (Signed)
Virtual Visit via Video Note   This visit type was conducted due to national recommendations for restrictions regarding the COVID-19 Pandemic (e.g. social distancing) in an effort to limit this patient's exposure and mitigate transmission in our community.  Due to her co-morbid illnesses, this patient is at least at moderate risk for complications without adequate follow up.  This format is felt to be most appropriate for this patient at this time.  All issues noted in this document were discussed and addressed.  A limited physical exam was performed with this format.  Please refer to the patient's chart for her consent to telehealth for [facility].   Date:  12/15/2018   ID:  Connie Norris, Connie Norris 07-Mar-1956, MRN [id number]  Patient Location: Home Provider Location: Office  PCP:  [name], MD  Cardiologist:  Shirlee More, MD  Electrophysiologist:  None   Evaluation Performed:  Follow-Up Visit  Chief Complaint:  Atrial fibrillation  History of Present Illness:    Connie Norris is a 63 y.o. 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  Last seen by me 02/16/17 at the request of her PCP for palpitations.   02/2017 echocardiogram: EF 55-60%, mild to moderate MR, mild TR, normal LA size  01/21/17 EKG: Independently reviewed shows SR rate 66 with PAC  The patient does not have symptoms concerning for COVID-19 infection (fever, chills, cough, or new shortness of breath).   After I had seen her last she purchase the iPhone adapter and she is captured frequent episodes and some total she has 17 that have been defined as atrial fibrillation they are brief occurring frequently and recently she had 2 in June.  Symptomatology is minimal chads 2 Vascor is now 2 with age female sex reluctant to take anticoagulation.  We negotiated she start aspirin 81 mg daily we will monitor response to plant-based diet with follow-up in 90 days and she will send  the strips to my office for review I suspect she does indeed have atrial fibrillation we discussed options for treatment include antiarrhythmic drug like flecainide or referral for pulmonary vein isolation.  If symptoms worsen become more frequent or more sustained she will contact me.  She does not have sleep apnea or thyroid disease does not use over-the-counter proarrhythmic drugs.  Since her last visit she has had a stress echo that is normal has an echocardiogram without any significant abnormality and has monitor self extensively with the iPhone kardia. Past Medical History:  Diagnosis Date  . Atrial fibrillation ([place])   . Hyperlipidemia 12/14/2018  . PONV (postoperative nausea and vomiting)    Past Surgical History:  Procedure Laterality Date  . ABDOMINAL HYSTERECTOMY    . [name] OSTEOTOMY Right 06/04/2017   Procedure: [employer];  Surgeon: [name], MD;  Location: [facility];  Service: Orthopedics;  Laterality: Right;  . CESAREAN SECTION    . METATARSAL OSTEOTOMY WITH BUNIONECTOMY Right 06/04/2017   Procedure: Right 1st metatarsal scarf osteotomy, modified McBride bunionectomy and  Akin osteotomy;  Surgeon: [name], MD;  Location: [facility];  Service: Orthopedics;  Laterality: Right;  . WRIST FRACTURE SURGERY     1999 left wrist     Current Meds  Medication Sig  . B Complex Vitamins (PA B-COMPLEX WITH B-12 PO) Take 1 mL by mouth every morning.  . Biotin 10000 MCG TABS Take 1 tablet by mouth daily.   Marland Kitchen co-enzyme Q-10 30 MG capsule Take  30 mg by mouth daily.   . TURMERIC PO Take 1 capsule by mouth daily.     Allergies:   Other and Penicillins   Social History   Tobacco Use  . Smoking status: Former Smoker    Types: Cigarettes    Last attempt to quit: 2008    Years since quitting: 12.4  . Smokeless tobacco: Never Used  Substance Use Topics  . Alcohol use: Yes    Alcohol/week: 2.0 standard drinks    Types: 2 Glasses of wine per  week    Comment: red wine 2 glasses per week   . Drug use: No     Family Hx: The patient's family history includes COPD in her brother and father; Cancer in her mother; Heart attack in her father and mother; Heart disease in her maternal grandmother; Parkinson's disease in her sister.  ROS:   Please see the history of present illness.     All other systems reviewed and are negative.   Prior CV studies:   The following studies were reviewed today:    Labs/Other Tests and Data Reviewed:    EKG:  An ECG dated 01/21/17 was personally reviewed today and demonstrated:  [place] 1 APC OW normal  Recent Labs: No results found for requested labs within last 8760 hours.   Recent Lipid Panel No results found for: CHOL, TRIG, HDL, CHOLHDL, LDLCALC, LDLDIRECT  Wt Readings from Last 3 Encounters:  12/15/18 154 lb (69.9 kg)  06/04/17 148 lb 9.6 oz (67.4 kg)  02/16/17 150 lb 6.4 oz (68.2 kg)     Objective:    Vital Signs:  BP 122/80   Pulse 65   Ht 5\' 4"  (1.626 m)   Wt 154 lb (69.9 kg)   BMI 26.43 kg/m    VITAL SIGNS:  reviewed GEN:  no acute distress EYES:  sclerae anicteric, EOMI - Extraocular Movements Intact RESPIRATORY:  normal respiratory effort, symmetric expansion CARDIOVASCULAR:  no peripheral edema SKIN:  no rash, lesions or ulcers. MUSCULOSKELETAL:  no obvious deformities. NEURO:  alert and oriented x 3, no obvious focal deficit PSYCH:  normal affect  ASSESSMENT & PLAN:     1.  Atrial fibrillation probable I need to review her strips but in general this device has the same accuracy of the medical EKG she wants to assess response to lifestyle particularly plant-based diet I think her risk is relatively low and I think that were safe for 90 days to use low-dose aspirin continue to monitor and then make a decision she will fax the strips to me and I will review them today or in the next few days depending on when they arrive.  If continued or increasingly symptomatic  atrial fibrillation I favor a fairly benign antiarrhythmic drug either Multaq or flecainide and would reserve EP consultation for failure of antiarrhythmic drug therapy.   COVID-19 Education: The signs and symptoms of COVID-19 were discussed with the patient and how to seek care for testing (follow up with PCP or arrange E-visit).  The importance of social distancing was discussed today.  Time:   Today, I have spent 28 minutes with the patient with telehealth technology discussing the above problems.     Medication Adjustments/Labs and Tests Ordered: Current medicines are reviewed at length with the patient today.  Concerns regarding medicines are outlined above.   Tests Ordered: No orders of the defined types were placed in this encounter.   Medication Changes: No orders of the defined types were  placed in this encounter.   Disposition:  Follow up in 3 month(s)  Signed, Shirlee More, MD  12/15/2018 10:09 AM    [facility]

## 2018-12-15 ENCOUNTER — Telehealth (INDEPENDENT_AMBULATORY_CARE_PROVIDER_SITE_OTHER): Payer: Medicare Other | Admitting: Cardiology

## 2018-12-15 ENCOUNTER — Other Ambulatory Visit: Payer: Self-pay

## 2018-12-15 ENCOUNTER — Telehealth: Payer: Self-pay | Admitting: *Deleted

## 2018-12-15 ENCOUNTER — Encounter: Payer: Self-pay | Admitting: Cardiology

## 2018-12-15 VITALS — BP 122/80 | HR 65 | Ht 64.0 in | Wt 154.0 lb

## 2018-12-15 DIAGNOSIS — Z8679 Personal history of other diseases of the circulatory system: Secondary | ICD-10-CM

## 2018-12-15 DIAGNOSIS — I491 Atrial premature depolarization: Secondary | ICD-10-CM | POA: Diagnosis not present

## 2018-12-15 DIAGNOSIS — E782 Mixed hyperlipidemia: Secondary | ICD-10-CM

## 2018-12-15 MED ORDER — ASPIRIN EC 81 MG PO TBEC: 81.0000 mg | DELAYED_RELEASE_TABLET | Freq: Every day | ORAL | 3 refills | Status: DC

## 2018-12-15 NOTE — Patient Instructions (Signed)
Medication Instructions:  Your physician has recommended you make the following change in your medication:   START: Aspirin 81mg  (1 tab) daily  If you need a refill on your cardiac medications before your next appointment, please call your pharmacy.   Lab work: None If you have labs (blood work) drawn today and your tests are completely normal, you will receive your results only by: Marland Kitchen MyChart Message (if you have MyChart) OR . A paper copy in the mail If you have any lab test that is abnormal or we need to change your treatment, we will call you to review the results.  Testing/Procedures: None  Follow-Up: At [facility], you and your health needs are our priority.  As part of our continuing mission to provide you with exceptional heart care, we have created designated Provider Care Teams.  These Care Teams include your primary Cardiologist (physician) and Advanced Practice Providers (APPs -  Physician Assistants and Nurse Practitioners) who all work together to provide you with the care you need, when you need it. You will need a follow up appointment in 3 months.   Any Other Special Instructions Will Be Listed Below (If Applicable).

## 2018-12-15 NOTE — Telephone Encounter (Signed)
Telephone call to patient to go over discharge instructions and AVS and make follow up appointment. Left message to call back.

## 2019-03-16 NOTE — Progress Notes (Signed)
Virtual Visit via Telephone Note   This visit type was conducted due to national recommendations for restrictions regarding the COVID-19 Pandemic (e.g. social distancing) in an effort to limit this patient's exposure and mitigate transmission in our community.  Due to her co-morbid illnesses, this patient is at least at moderate risk for complications without adequate follow up.  This format is felt to be most appropriate for this patient at this time.  The patient did not have access to video technology/had technical difficulties with video requiring transitioning to audio format only (telephone).  All issues noted in this document were discussed and addressed.  No physical exam could be performed with this format.  Please refer to the patient's chart for her  consent to telehealth for [facility].Date:  03/17/2019   ID:  Connie Norris, DOB 1955/08/10, MRN [id number]  PCP:  [name], MD  Cardiologist:  Shirlee More, MD    Referring MD: [name], MD    ASSESSMENT:    1. Palpitations    PLAN:    In order of problems listed above:  1. She really needs as an office visit she needs to bring her monitor strips reviewed him and if she indeed has atrial fibrillation give her a choice of referral to EP for catheter ablation or I would prefer antiarrhythmic therapy with an agent like flecainide with normal structural heart sure that we check baseline labs including TSH renal function CBC and TSH some rate limiting dose of beta-blocker and a discussion about the merits of anticoagulation.  Is in agreement   Next appointment: Next week in office to review her monitor strips from her iPhone   Medication Adjustments/Labs and Tests Ordered: Current medicines are reviewed at length with the patient today.  Concerns regarding medicines are outlined above.  No orders of the defined types were placed in this encounter.  No orders of the defined types were placed in this encounter.    Complaint is palpitation, she has an iPhone adapter and is telling her she is having paroxysmal atrial fibrillation  History of Present Illness:    Connie Norris is a 63 y.o. 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 Compliance with diet, lifestyle and medications: Yes  Made a big change for her lifestyle and continues to have palpitation and has a device with her iPhone this telling her she is having atrial fibrillation.  She is a little frustrated today with technology we could not establish a video link she could not send the cardia strips and after discussion of the importance of a precise diagnosis and treatment of atrial fibrillation has agreed to come to the [place] office next week I am going to be in [place] for several weeks and she will see Dr. Harriet Masson and indeed if she has atrial fibrillation documented I would place her in an antiarrhythmic drug like flecainide normal dose of rate limiting beta-blocker and discuss with her whether she wants to be anticoagulated.  Her stroke risk actively low with age 46 and female sex.  No syncope TIA chest pain shortness of breath or palpitation  02/2017 echocardiogram: EF 55-60%, mild to moderate MR, mild TR, normal LA size Past Medical History:  Diagnosis Date  . Atrial fibrillation ([place])   . Hyperlipidemia 12/14/2018  . PONV (postoperative nausea and vomiting)     Past Surgical History:  Procedure Laterality Date  .  ABDOMINAL HYSTERECTOMY    . [name] OSTEOTOMY Right 06/04/2017   Procedure: [employer];  Surgeon: [name], MD;  Location: [place];  Service: Orthopedics;  Laterality: Right;  . CESAREAN SECTION    . METATARSAL OSTEOTOMY WITH BUNIONECTOMY Right 06/04/2017   Procedure: Right 1st metatarsal scarf osteotomy, modified McBride bunionectomy and  Akin osteotomy;  Surgeon: [name], MD;  Location: [place];  Service: Orthopedics;  Laterality: Right;  . WRIST FRACTURE SURGERY     1999 left wrist    Current Medications: Current Meds  Medication Sig  . aspirin EC 81 MG tablet Take 1 tablet (81 mg total) by mouth daily.  . B Complex Vitamins (PA B-COMPLEX WITH B-12 PO) Take 1 mL by mouth every morning.  . Biotin 10000 MCG TABS Take 1 tablet by mouth daily.   Marland Kitchen co-enzyme Q-10 30 MG capsule Take 30 mg by mouth daily.   . TURMERIC PO Take 1 capsule by mouth daily.     Allergies:   Other and Penicillins   Social History   Socioeconomic History  . Marital status: Married    Spouse name: Not on file  . Number of children: Not on file  . Years of education: Not on file  . Highest education level: Not on file  Occupational History  . Not on file  Social Needs  . Financial resource strain: Not on file  . Food insecurity    Worry: Not on file    Inability: Not on file  . Transportation needs    Medical: Not on file    Non-medical: Not on file  Tobacco Use  . Smoking status: Former Smoker    Types: Cigarettes    Quit date: 2008    Years since quitting: 12.7  . Smokeless tobacco: Never Used  Substance and Sexual Activity  . Alcohol use: Yes    Alcohol/week: 2.0 standard drinks    Types: 2 Glasses of wine per week    Comment: red wine 2 glasses per week   . Drug use: No  . Sexual activity: Not on file  Lifestyle  . Physical activity    Days per week: Not on file    Minutes per session: Not on file  . Stress: Not on file  Relationships  . Social Herbalist on phone: Not on file    Gets together: Not on file    Attends religious service: Not on file    Active member of club or organization: Not on file    Attends meetings of clubs or organizations: Not on file    Relationship status: Not on file  Other Topics Concern  . Not on file  Social History Narrative  . Not on file     Family History: The patient's family history includes COPD in her brother and  father; Cancer in her mother; Heart attack in her father and mother; Heart disease in her maternal grandmother; Parkinson's disease in her sister. ROS:   Please see the history of present illness.    All other systems reviewed and are negative.  EKGs/Labs/Other Studies Reviewed:    The following studies were reviewed today:  Physical Exam:    VS:  Pulse 67   Ht 5' 0.5" (1.537 m)   Wt 152 lb (68.9 kg)   BMI 29.20 kg/m     Wt Readings from Last 3 Encounters:  03/17/19 152 lb (68.9 kg)  12/15/18 154 lb (69.9 kg)  06/04/17 148 lb 9.6 oz (67.4 kg)     Her vital signs were reviewed she was in no distress and had no audible audible wheezing    Signed, Shirlee More, MD  03/17/2019 9:05 AM    [place] her vital

## 2019-03-17 ENCOUNTER — Telehealth (INDEPENDENT_AMBULATORY_CARE_PROVIDER_SITE_OTHER): Payer: Medicare Other | Admitting: Cardiology

## 2019-03-17 ENCOUNTER — Encounter: Payer: Self-pay | Admitting: Cardiology

## 2019-03-17 ENCOUNTER — Other Ambulatory Visit: Payer: Self-pay

## 2019-03-17 VITALS — HR 67 | Ht 60.5 in | Wt 152.0 lb

## 2019-03-17 DIAGNOSIS — R002 Palpitations: Secondary | ICD-10-CM

## 2019-03-17 NOTE — Patient Instructions (Signed)
Medication Instructions:  Your physician recommends that you continue on your current medications as directed. Please refer to the Current Medication list given to you today.  If you need a refill on your cardiac medications before your next appointment, please call your pharmacy.   Lab work: None  If you have labs (blood work) drawn today and your tests are completely normal, you will receive your results only by: Marland Kitchen MyChart Message (if you have MyChart) OR . A paper copy in the mail If you have any lab test that is abnormal or we need to change your treatment, we will call you to review the results.  Testing/Procedures: None  Follow-Up: At [facility], you and your health needs are our priority.  As part of our continuing mission to provide you with exceptional heart care, we have created designated Provider Care Teams.  These Care Teams include your primary Cardiologist (physician) and Advanced Practice Providers (APPs -  Physician Assistants and Nurse Practitioners) who all work together to provide you with the care you need, when you need it. You will need a follow up appointment in 1 weeks with Dr. Harriet Masson in the [facility] office.

## 2019-04-05 NOTE — Progress Notes (Signed)
Cardiology Office Note:    Date:  04/06/2019   ID:  Connie, Norris 06/16/1956, MRN [id number]  PCP:  [name], MD  Cardiologist:  Shirlee More, MD    Referring MD: [name], MD    ASSESSMENT:    1. Palpitations   2. APC (atrial premature contractions)    PLAN:    In order of problems listed above:  1. Documented atrial fibrillation with significant burden but so far has not been persistent.  She agrees to initiate outpatient antiarrhythmic therapy low-dose flecainide minimal dose of beta-blocker and transition from aspirin to Xarelto.  She will follow-up in the office with an EKG and a check with her cardiology nurse practitioner.  20 minutes was spent with the patient reviewing strips discussing atrial fibrillation stroke risk options for treatment and shared decision making.   Next appointment: 2 weeks   Medication Adjustments/Labs and Tests Ordered: Current medicines are reviewed at length with the patient today.  Concerns regarding medicines are outlined above.  No orders of the defined types were placed in this encounter.  No orders of the defined types were placed in this encounter.   Chief Complaint  Patient presents with  . Follow-up    for ? atrial fibrillation    History of Present Illness:    Connie Norris is a 63 y.o. female with a hx of palpitation and possible PAF on [place] recordings last seen 03/17/2019 virtual visit.She was unable to transmit her EKG readings to my office. Compliance with diet, lifestyle and medications: Yes  02/2017 echocardiogram: EF 55-60%, mild to moderate MR, mild TR, normal LA size  01/21/17 EKG: Independently reviewed shows SR rate 66 with PAC  She is very well prepared brought a binder reviewed the strips and has multiple episodes of clear-cut atrial fibrillation with a rapid ventricular rate up to 140-150 bpm.  So far the episodes have not lasted more than about an hour.  We had a nice opportunity discussed  the natural history of the disease the symptoms that are bothersome her stroke risk and will organ to do stop aspirin start Xarelto 20 mg daily normal renal function initiate outpatient antiarrhythmic therapy with flecainide 50 mg twice daily and a minimal dose of beta-blocker Toprol-XL 12 and half milligrams daily.  Come back in 2 weeks to have an EKG and a check with Connie Norris nurse practitioner.  If ineffective she is interested in EP catheter ablation.  I do not think we need to repeat echocardiogram she had lab work in her PCP office in February and normal thyroid test a couple years ago and no history of thyroid disease  Past Medical History:  Diagnosis Date  . Atrial fibrillation ([place])   . Hyperlipidemia 12/14/2018  . PONV (postoperative nausea and vomiting)     Past Surgical History:  Procedure Laterality Date  . ABDOMINAL HYSTERECTOMY    . [name] OSTEOTOMY Right 06/04/2017   Procedure: [employer];  Surgeon: [name], MD;  Location: [place];  Service: Orthopedics;  Laterality: Right;  . CESAREAN SECTION    . METATARSAL OSTEOTOMY WITH BUNIONECTOMY Right 06/04/2017   Procedure: Right 1st metatarsal scarf osteotomy, modified McBride bunionectomy and  Akin osteotomy;  Surgeon: [name], MD;  Location: [place];  Service: Orthopedics;  Laterality: Right;  . WRIST FRACTURE SURGERY     1999 left wrist    Current Medications: Current Meds  Medication Sig  . B Complex Vitamins (PA B-COMPLEX WITH B-12 PO)  Take 1 mL by mouth every morning.  . Biotin 10000 MCG TABS Take 1 tablet by mouth daily.   Marland Kitchen co-enzyme Q-10 30 MG capsule Take 30 mg by mouth daily.   Marland Kitchen thiamine (VITAMIN B-1) 100 MG tablet Take 100 mg by mouth daily.  . TURMERIC PO Take 1 capsule by mouth daily.  . [DISCONTINUED] aspirin EC 81 MG tablet Take 1 tablet (81 mg total) by mouth daily.     Allergies:   Other and Penicillins   Social History   Socioeconomic History  .  Marital status: Married    Spouse name: Not on file  . Number of children: Not on file  . Years of education: Not on file  . Highest education level: Not on file  Occupational History  . Not on file  Social Needs  . Financial resource strain: Not on file  . Food insecurity    Worry: Not on file    Inability: Not on file  . Transportation needs    Medical: Not on file    Non-medical: Not on file  Tobacco Use  . Smoking status: Former Smoker    Types: Cigarettes    Quit date: 2008    Years since quitting: 12.7  . Smokeless tobacco: Never Used  Substance and Sexual Activity  . Alcohol use: Yes    Alcohol/week: 2.0 standard drinks    Types: 2 Glasses of wine per week    Comment: red wine 2 glasses per week   . Drug use: No  . Sexual activity: Not on file  Lifestyle  . Physical activity    Days per week: Not on file    Minutes per session: Not on file  . Stress: Not on file  Relationships  . Social Herbalist on phone: Not on file    Gets together: Not on file    Attends religious service: Not on file    Active member of club or organization: Not on file    Attends meetings of clubs or organizations: Not on file    Relationship status: Not on file  Other Topics Concern  . Not on file  Social History Narrative  . Not on file     Family History: The patient's family history includes COPD in her brother and father; Cancer in her mother; Heart attack in her father and mother; Heart disease in her maternal grandmother; Parkinson's disease in her sister. ROS:   Please see the history of present illness.    All other systems reviewed and are negative.  EKGs/Labs/Other Studies Reviewed:    The following studies were reviewed today:  Personally reviewed a binder of heart rhythm strips  Recent Labs: No results found for requested labs within last 8760 hours.  Recent Lipid Panel No results found for: CHOL, TRIG, HDL, CHOLHDL, VLDL, LDLCALC, LDLDIRECT   Physical Exam:    VS:  BP 124/86 (BP Location: Right Arm, Patient Position: Sitting, Cuff Size: Normal)   Pulse 69   Temp (!) 97.3 F (36.3 C)   Ht 5' 4.5" (1.638 m)   Wt 157 lb 1.9 oz (71.3 kg)   SpO2 98%   BMI 26.55 kg/m     Wt Readings from Last 3 Encounters:  04/06/19 157 lb 1.9 oz (71.3 kg)  03/17/19 152 lb (68.9 kg)  12/15/18 154 lb (69.9 kg)     GEN:  Well nourished, well developed in no acute distress HEENT: Normal NECK: No JVD; No carotid bruits  LYMPHATICS: No lymphadenopathy CARDIAC: RRR, no murmurs, rubs, gallops RESPIRATORY:  Clear to auscultation without rales, wheezing or rhonchi  ABDOMEN: Soft, non-tender, non-distended MUSCULOSKELETAL:  No edema; No deformity  SKIN: Warm and dry NEUROLOGIC:  Alert and oriented x 3 PSYCHIATRIC:  Normal affect    Signed, Shirlee More, MD  04/06/2019 3:37 PM     [facility]

## 2019-04-06 ENCOUNTER — Encounter: Payer: Self-pay | Admitting: Cardiology

## 2019-04-06 ENCOUNTER — Other Ambulatory Visit: Payer: Self-pay

## 2019-04-06 ENCOUNTER — Ambulatory Visit (INDEPENDENT_AMBULATORY_CARE_PROVIDER_SITE_OTHER): Payer: Medicare Other | Admitting: Cardiology

## 2019-04-06 VITALS — BP 124/86 | HR 69 | Temp 97.3°F | Ht 64.5 in | Wt 157.1 lb

## 2019-04-06 DIAGNOSIS — Z8679 Personal history of other diseases of the circulatory system: Secondary | ICD-10-CM

## 2019-04-06 DIAGNOSIS — I491 Atrial premature depolarization: Secondary | ICD-10-CM

## 2019-04-06 DIAGNOSIS — R002 Palpitations: Secondary | ICD-10-CM | POA: Diagnosis not present

## 2019-04-06 MED ORDER — FLECAINIDE ACETATE 50 MG PO TABS: 50.0000 mg | ORAL_TABLET | Freq: Two times a day (BID) | ORAL | 3 refills | Status: DC

## 2019-04-06 MED ORDER — RIVAROXABAN 20 MG PO TABS: 20.0000 mg | ORAL_TABLET | Freq: Every day | ORAL | 3 refills | Status: DC

## 2019-04-06 MED ORDER — METOPROLOL SUCCINATE ER 25 MG PO TB24: 12.5000 mg | ORAL_TABLET | Freq: Every day | ORAL | 3 refills | Status: DC

## 2019-04-06 NOTE — Patient Instructions (Addendum)
Medication Instructions:  Your physician has recommended you make the following change in your medication:   STOP aspirin  START flecainide (tambocor) 50 mg: Take 1 tablet twice daily START rivaroxaban (xarelto) 20 mg: Take 1 tablet daily with supper START metoprolol succinate (toprol XL) 25 mg: Take 0.5 tablet (12.5 mg) daily   If you need a refill on your cardiac medications before your next appointment, please call your pharmacy.   Lab work: None If you have labs (blood work) drawn today and your tests are completely normal, you will receive your results only by: Marland Kitchen MyChart Message (if you have MyChart) OR . A paper copy in the mail If you have any lab test that is abnormal or we need to change your treatment, we will call you to review the results.  Testing/Procedures: Your physician has requested that you have an echocardiogram. Echocardiography is a painless test that uses sound waves to create images of your heart. It provides your doctor with information about the size and shape of your heart and how well your heart's chambers and valves are working. This procedure takes approximately one hour. There are no restrictions for this procedure.   Follow-Up: At [facility], you and your health needs are our priority.  As part of our continuing mission to provide you with exceptional heart care, we have created designated Provider Care Teams.  These Care Teams include your primary Cardiologist (physician) and Advanced Practice Providers (APPs -  Physician Assistants and Nurse Practitioners) who all work together to provide you with the care you need, when you need it. You will need a follow up appointment in 2 weeks with Laurann Montana, NP in the [facility] office.    Flecainide tablets What is this medicine? FLECAINIDE (FLEK a nide) is an antiarrhythmic drug. This medicine is used to prevent irregular heart rhythm. It can also slow down fast heartbeats called tachycardia. This  medicine may be used for other purposes; ask your health care provider or pharmacist if you have questions. COMMON BRAND NAME(S): Tambocor What should I tell my health care provider before I take this medicine? They need to know if you have any of these conditions:  abnormal levels of potassium in the blood  heart disease including heart rhythm and heart rate problems  kidney or liver disease  recent heart attack  an unusual or allergic reaction to flecainide, local anesthetics, other medicines, foods, dyes, or preservatives  pregnant or trying to get pregnant  breast-feeding How should I use this medicine? Take this medicine by mouth with a glass of water. Follow the directions on the prescription label. You can take this medicine with or without food. Take your doses at regular intervals. Do not take your medicine more often than directed. Do not stop taking this medicine suddenly. This may cause serious, heart-related side effects. If your doctor wants you to stop the medicine, the dose may be slowly lowered over time to avoid any side effects. Talk to your pediatrician regarding the use of this medicine in children. While this drug may be prescribed for children as young as 1 year of age for selected conditions, precautions do apply. Overdosage: If you think you have taken too much of this medicine contact a poison control center or emergency room at once. NOTE: This medicine is only for you. Do not share this medicine with others. What if I miss a dose? If you miss a dose, take it as soon as you can. If it is  almost time for your next dose, take only that dose. Do not take double or extra doses. What may interact with this medicine? Do not take this medicine with any of the following medications:  amoxapine  arsenic trioxide  certain antibiotics like clarithromycin, erythromycin, gatifloxacin, gemifloxacin, levofloxacin, moxifloxacin, sparfloxacin, or troleandomycin  certain  antidepressants called tricyclic antidepressants like amitriptyline, imipramine, or nortriptyline  certain medicines to control heart rhythm like disopyramide, encainide, moricizine, procainamide, propafenone, and quinidine  cisapride  delavirdine  droperidol  haloperidol  hawthorn  imatinib  levomethadyl  maprotiline  medicines for malaria like chloroquine and halofantrine  pentamidine  phenothiazines like chlorpromazine, mesoridazine, prochlorperazine, thioridazine  pimozide  quinine  ranolazine  ritonavir  sertindole This medicine may also interact with the following medications:  cimetidine  dofetilide  medicines for angina or high blood pressure  medicines to control heart rhythm like amiodarone and digoxin  ziprasidone This list may not describe all possible interactions. Give your health care provider a list of all the medicines, herbs, non-prescription drugs, or dietary supplements you use. Also tell them if you smoke, drink alcohol, or use illegal drugs. Some items may interact with your medicine. What should I watch for while using this medicine? Visit your doctor or health care professional for regular checks on your progress. Because your condition and the use of this medicine carries some risk, it is a good idea to carry an identification card, necklace or bracelet with details of your condition, medications and doctor or health care professional. Check your blood pressure and pulse rate regularly. Ask your health care professional what your blood pressure and pulse rate should be, and when you should contact him or her. Your doctor or health care professional also may schedule regular blood tests and electrocardiograms to check your progress. You may get drowsy or dizzy. Do not drive, use machinery, or do anything that needs mental alertness until you know how this medicine affects you. Do not stand or sit up quickly, especially if you are an older  patient. This reduces the risk of dizzy or fainting spells. Alcohol can make you more dizzy, increase flushing and rapid heartbeats. Avoid alcoholic drinks. What side effects may I notice from receiving this medicine? Side effects that you should report to your doctor or health care professional as soon as possible:  chest pain, continued irregular heartbeats  difficulty breathing  swelling of the legs or feet  trembling, shaking  unusually weak or tired Side effects that usually do not require medical attention (report to your doctor or health care professional if they continue or are bothersome):  blurred vision  constipation  headache  nausea, vomiting  stomach pain This list may not describe all possible side effects. Call your doctor for medical advice about side effects. You may report side effects to FDA at 1-800-FDA-1088. Where should I keep my medicine? Keep out of the reach of children. Store at room temperature between 15 and 30 degrees C (59 and 86 degrees F). Protect from light. Keep container tightly closed. Throw away any unused medicine after the expiration date. NOTE: This sheet is a summary. It may not cover all possible information. If you have questions about this medicine, talk to your doctor, pharmacist, or health care provider.  2020 Elsevier/Gold Standard (2018-06-14 11:41:38)   Rivaroxaban oral tablets What is this medicine? RIVAROXABAN (ri va ROX a ban) is an anticoagulant (blood thinner). It is used to treat blood clots in the lungs or in the veins. It  is also used to prevent blood clots in the lungs or in the veins. It is also used to lower the chance of stroke in people with a medical condition called atrial fibrillation. This medicine may be used for other purposes; ask your health care provider or pharmacist if you have questions. COMMON BRAND NAME(S): Xarelto, Xarelto Starter Pack What should I tell my health care provider before I take this  medicine? They need to know if you have any of these conditions:  antiphospholipid antibody syndrome  artificial heart valve  bleeding disorders  bleeding in the brain  blood in your stools (black or tarry stools) or if you have blood in your vomit  history of blood clots  history of stomach bleeding  kidney disease  liver disease  low blood counts, like low white cell, platelet, or red cell counts  recent or planned spinal or epidural procedure  take medicines that treat or prevent blood clots  an unusual or allergic reaction to rivaroxaban, other medicines, foods, dyes, or preservatives  pregnant or trying to get pregnant  breast-feeding How should I use this medicine? Take this medicine by mouth with a glass of water. Follow the directions on the prescription label. Take your medicine at regular intervals. Do not take it more often than directed. Do not stop taking except on your doctor's advice. Stopping this medicine may increase your risk of a blood clot. Be sure to refill your prescription before you run out of medicine. If you are taking this medicine after hip or knee replacement surgery, take it with or without food. If you are taking this medicine for atrial fibrillation, take it with your evening meal. If you are taking this medicine to treat blood clots, take it with food at the same time each day. If you are unable to swallow your tablet, you may crush the tablet and mix it in applesauce. Then, immediately eat the applesauce. You should eat more food right after you eat the applesauce containing the crushed tablet. Talk to your pediatrician regarding the use of this medicine in children. Special care may be needed. Overdosage: If you think you have taken too much of this medicine contact a poison control center or emergency room at once. NOTE: This medicine is only for you. Do not share this medicine with others. What if I miss a dose? If you take your medicine  once a day and miss a dose, take the missed dose as soon as you remember. If it is almost time for your next dose, take only that dose. Do not take double or extra doses. If you take your medicine twice a day and miss a dose, take the missed dose immediately. In this instance, 2 tablets may be taken at the same time. The next day you should take 1 tablet twice a day as directed. What may interact with this medicine? Do not take this medicine with any of the following medications:  defibrotide This medicine may also interact with the following medications:  aspirin and aspirin-like medicines  certain antibiotics like erythromycin, azithromycin, and clarithromycin  certain medicines for fungal infections like ketoconazole and itraconazole  certain medicines for irregular heart beat like amiodarone, quinidine, dronedarone  certain medicines for seizures like carbamazepine, phenytoin  certain medicines that treat or prevent blood clots like warfarin, enoxaparin, and dalteparin  conivaptan  felodipine  indinavir  lopinavir; ritonavir  NSAIDS, medicines for pain and inflammation, like ibuprofen or naproxen  ranolazine  rifampin  ritonavir  SNRIs, medicines for depression, like desvenlafaxine, duloxetine, levomilnacipran, venlafaxine  SSRIs, medicines for depression, like citalopram, escitalopram, fluoxetine, fluvoxamine, paroxetine, sertraline  St. John's wort  verapamil This list may not describe all possible interactions. Give your health care provider a list of all the medicines, herbs, non-prescription drugs, or dietary supplements you use. Also tell them if you smoke, drink alcohol, or use illegal drugs. Some items may interact with your medicine. What should I watch for while using this medicine? Visit your healthcare professional for regular checks on your progress. You may need blood work done while you are taking this medicine. Your condition will be monitored  carefully while you are receiving this medicine. It is important not to miss any appointments. Avoid sports and activities that might cause injury while you are using this medicine. Severe falls or injuries can cause unseen bleeding. Be careful when using sharp tools or knives. Consider using an Copy. Take special care brushing or flossing your teeth. Report any injuries, bruising, or red spots on the skin to your healthcare professional. If you are going to need surgery or other procedure, tell your healthcare professional that you are taking this medicine. Wear a medical ID bracelet or chain. Carry a card that describes your disease and details of your medicine and dosage times. What side effects may I notice from receiving this medicine? Side effects that you should report to your doctor or health care professional as soon as possible:  allergic reactions like skin rash, itching or hives, swelling of the face, lips, or tongue  back pain  redness, blistering, peeling or loosening of the skin, including inside the mouth  signs and symptoms of bleeding such as bloody or black, tarry stools; red or dark-brown urine; spitting up blood or brown material that looks like coffee grounds; red spots on the skin; unusual bruising or bleeding from the eye, gums, or nose  signs and symptoms of a blood clot such as chest pain; shortness of breath; pain, swelling, or warmth in the leg  signs and symptoms of a stroke such as changes in vision; confusion; trouble speaking or understanding; severe headaches; sudden numbness or weakness of the face, arm or leg; trouble walking; dizziness; loss of coordination Side effects that usually do not require medical attention (report to your doctor or health care professional if they continue or are bothersome):  dizziness  muscle pain This list may not describe all possible side effects. Call your doctor for medical advice about side effects. You may report  side effects to FDA at 1-800-FDA-1088. Where should I keep my medicine? Keep out of the reach of children. Store at room temperature between 15 and 30 degrees C (59 and 86 degrees F). Throw away any unused medicine after the expiration date. NOTE: This sheet is a summary. It may not cover all possible information. If you have questions about this medicine, talk to your doctor, pharmacist, or health care provider.  2020 Elsevier/Gold Standard (2018-09-20 09:45:59)   Metoprolol extended-release tablets What is this medicine? METOPROLOL (me TOE proe lole) is a beta-blocker. Beta-blockers reduce the workload on the heart and help it to beat more regularly. This medicine is used to treat high blood pressure and to prevent chest pain. It is also used to after a heart attack and to prevent an additional heart attack from occurring. This medicine may be used for other purposes; ask your health care provider or pharmacist if you have questions. COMMON BRAND NAME(S): toprol, Toprol XL What  should I tell my health care provider before I take this medicine? They need to know if you have any of these conditions:  diabetes  heart or vessel disease like slow heart rate, worsening heart failure, heart block, sick sinus syndrome or Raynaud's disease  kidney disease  liver disease  lung or breathing disease, like asthma or emphysema  pheochromocytoma  thyroid disease  an unusual or allergic reaction to metoprolol, other beta-blockers, medicines, foods, dyes, or preservatives  pregnant or trying to get pregnant  breast-feeding How should I use this medicine? Take this medicine by mouth with a glass of water. Follow the directions on the prescription label. Do not crush or chew. Take this medicine with or immediately after meals. Take your doses at regular intervals. Do not take more medicine than directed. Do not stop taking this medicine suddenly. This could lead to serious heart-related  effects. Talk to your pediatrician regarding the use of this medicine in children. While this drug may be prescribed for children as young as 6 years for selected conditions, precautions do apply. Overdosage: If you think you have taken too much of this medicine contact a poison control center or emergency room at once. NOTE: This medicine is only for you. Do not share this medicine with others. What if I miss a dose? If you miss a dose, take it as soon as you can. If it is almost time for your next dose, take only that dose. Do not take double or extra doses. What may interact with this medicine? This medicine may interact with the following medications:  certain medicines for blood pressure, heart disease, irregular heart beat  certain medicines for depression, like monoamine oxidase (MAO) inhibitors, fluoxetine, or paroxetine  clonidine  dobutamine  epinephrine  isoproterenol  reserpine This list may not describe all possible interactions. Give your health care provider a list of all the medicines, herbs, non-prescription drugs, or dietary supplements you use. Also tell them if you smoke, drink alcohol, or use illegal drugs. Some items may interact with your medicine. What should I watch for while using this medicine? Visit your doctor or health care professional for regular check ups. Contact your doctor right away if your symptoms worsen. Check your blood pressure and pulse rate regularly. Ask your health care professional what your blood pressure and pulse rate should be, and when you should contact them. You may get drowsy or dizzy. Do not drive, use machinery, or do anything that needs mental alertness until you know how this medicine affects you. Do not sit or stand up quickly, especially if you are an older patient. This reduces the risk of dizzy or fainting spells. Contact your doctor if these symptoms continue. Alcohol may interfere with the effect of this medicine. Avoid  alcoholic drinks. This medicine may increase blood sugar. Ask your healthcare provider if changes in diet or medicines are needed if you have diabetes. What side effects may I notice from receiving this medicine? Side effects that you should report to your doctor or health care professional as soon as possible:  allergic reactions like skin rash, itching or hives  cold or numb hands or feet  depression  difficulty breathing  faint  fever with sore throat  irregular heartbeat, chest pain  rapid weight gain   signs and symptoms of high blood sugar such as being more thirsty or hungry or having to urinate more than normal. You may also feel very tired or have blurry vision.  swollen legs  or ankles Side effects that usually do not require medical attention (report to your doctor or health care professional if they continue or are bothersome):  anxiety or nervousness  change in sex drive or performance  dry skin  headache  nightmares or trouble sleeping  short term memory loss  stomach upset or diarrhea This list may not describe all possible side effects. Call your doctor for medical advice about side effects. You may report side effects to FDA at 1-800-FDA-1088. Where should I keep my medicine? Keep out of the reach of children. Store at room temperature between 15 and 30 degrees C (59 and 86 degrees F). Throw away any unused medicine after the expiration date. NOTE: This sheet is a summary. It may not cover all possible information. If you have questions about this medicine, talk to your doctor, pharmacist, or health care provider.  2020 Elsevier/Gold Standard (2018-04-13 11:09:41)

## 2019-04-08 ENCOUNTER — Telehealth: Payer: Self-pay | Admitting: Cardiology

## 2019-04-08 NOTE — Telephone Encounter (Signed)
Pt c/o medication issue:  1. Name of Medication: rivaroxaban (XARELTO) 20 MG TABS tablet  2. How are you currently taking this medication (dosage and times per day)? 20 mg once daily  3. Are you having a reaction (difficulty breathing--STAT)? Diarrhea   4. What is your medication issue? Patient has questions with her dosage of Xarelto and how it could interact with her diet. She eats a lot of vegetables( spinach and broccoli), and read that Vitamin K can interact with the blood thinner. She takes Tumeric as well.  She also had her first session of acupuncture as well as a glass of wine last night. She just wants to know if any or all of these things could potentially interact with her xarelto

## 2019-04-08 NOTE — Telephone Encounter (Signed)
Patient returned call with several questions regarding Xarelto.  Patient states that she started Xarelto on Wednesday night and woke up with diarrhea this morning.  She also takes turmeric and would like to know if she can continue taking with Xarelto.  Patient also had questions regarding vegetable intake and eating too many vegetables effecting her vitamin K level.  Patient had acupuncture on shoulder yesterday and is concerned about any interactions with Xarelto.  She also like to have one glass of wine each night and would like to know if this will be ok.  Dr Bettina Gavia advised that patient can have one glass of wine each night while taking Xarelto.  Patient instructed to speak with the office doing acupuncture for an answer regarding acupuncture and Xarelto.  Patient advised that she can eat veggies such as spinach and broccoli.  Patient instructed that she can continue using turmeric.    Dr Bettina Gavia also advised that he does not think there is any correlation between taking Xarelto and having diarrhea. Patient was advised that she could hold dose of Xarelto over the weekend and restart on Monday.  She agreed to plan and verbalized understanding.  No further questions.

## 2019-04-08 NOTE — Telephone Encounter (Signed)
Called patient on her cell phone and she requested to call her home phone as she does not have good service on her cell phone at home. Called patient's home phone twice with no answer, unable to leave voicemail. Will continue efforts.

## 2019-04-19 NOTE — Progress Notes (Signed)
Office Visit    Patient Name: Connie Norris Date of Encounter: 04/20/2019  Primary Care Provider:  [name], MD Primary Cardiologist:  Connie More, MD Electrophysiologist:  None   Chief Complaint    Connie Norris is a 63 y.o. female with a hx of palpitations, PACs, PAF presents today for follow up after flecainide initiation.   Past Medical History    Past Medical History:  Diagnosis Date  . Atrial fibrillation ([place])   . Hyperlipidemia 12/14/2018  . PONV (postoperative nausea and vomiting)    Past Surgical History:  Procedure Laterality Date  . ABDOMINAL HYSTERECTOMY    . [name] OSTEOTOMY Norris 06/04/2017   Procedure: Connie Norris;  Surgeon: Connie Simmer, MD;  Location: [place];  Service: Orthopedics;  Laterality: Norris;  . CESAREAN SECTION    . METATARSAL OSTEOTOMY WITH BUNIONECTOMY Norris 06/04/2017   Procedure: Norris 1st metatarsal scarf osteotomy, modified McBride bunionectomy and  Akin osteotomy;  Surgeon: Connie Simmer, MD;  Location: [place];  Service: Orthopedics;  Laterality: Norris;  . WRIST FRACTURE SURGERY     1999 left wrist    Allergies  Allergies  Allergen Reactions  . Other     Alpha-gal (resulted from tick bite, now allergic to meats)  . Penicillins     History of Present Illness    Connie Norris is a 63 y.o. female with a hx of palpitations, PACs, PAF last seen 04/06/19 by Dr. Bettina Norris.  Her atrial fibrillation was noted with Kardia. Reviewed by Dr. Bettina Norris showed afib with RVR up to 140-150 bpm lasting less than an hour. Normal TSH in February by PCP. She was started on Xarelto 20mg  daily, flecainide 50mg  daily, and Toprol 12.5mg  daily.   She reports no recurrent episodes of atrial fibrillation. She does not extreme fatigue. Has been trying to walk 3 miles per day as a part of cancer awareness with her church group.   EKGs/Labs/Other Studies Reviewed:   The following studies were reviewed today:  Echocardiogram 04/20/2019  1. Left ventricular ejection fraction, by visual estimation, is 60 to 65%. The left ventricle has normal function. Normal left ventricular size. There is no left ventricular hypertrophy.  2. Global Norris ventricle has normal systolic function.The Norris ventricular size is normal. No increase in Norris ventricular wall thickness.  3. Left atrial size was normal.  4. Norris atrial size was normal.  5. The mitral valve is normal in structure. Mild mitral valve regurgitation. No evidence of mitral stenosis.  6. The tricuspid valve is normal in structure. Tricuspid valve regurgitation is trivial.  7. The aortic valve is normal in structure. Aortic valve regurgitation was not visualized by color flow Doppler. Structurally normal aortic valve, with no evidence of sclerosis or stenosis.  8. The pulmonic valve was normal in structure. Pulmonic valve regurgitation is mild by color flow Doppler.  9. Normal pulmonary artery systolic pressure. 10. The inferior vena cava is normal in size with greater than 50% respiratory variability, suggesting Norris atrial pressure of 3 mmHg.  02/2017 echocardiogram: EF 55-60%, mild to moderate MR, mild TR, normal LA size   01/21/17 EKG: Independently reviewed shows SR rate 66 with PAC  EKG:  EKG is ordered today.  The ekg ordered today demonstrates SR rate 66 bpm with normal QTc of 425 - no acute ST/T wave changes.   Recent Labs: No results found for requested labs within last 8760 hours.  Recent Lipid Panel No results found for: CHOL, TRIG, HDL,  CHOLHDL, VLDL, LDLCALC, LDLDIRECT  Home Medications   Current Meds  Medication Sig  . B Complex Vitamins (PA B-COMPLEX WITH B-12 PO) Take 1 mL by mouth every morning.  . Biotin 10000 MCG TABS Take 1 tablet by mouth daily.   Marland Kitchen co-enzyme Q-10 30 MG capsule Take 30 mg by mouth daily.   . flecainide (TAMBOCOR) 50 MG tablet Take 1 tablet (50 mg total) by mouth 2 (two) times daily.  . metoprolol  succinate (TOPROL-XL) 25 MG 24 hr tablet Take 0.5 tablets (12.5 mg total) by mouth daily.  . rivaroxaban (XARELTO) 20 MG TABS tablet Take 1 tablet (20 mg total) by mouth daily with supper.  . thiamine (VITAMIN B-1) 100 MG tablet Take 100 mg by mouth daily.  . TURMERIC PO Take 1 capsule by mouth daily.      Review of Systems      ROS All other systems reviewed and are otherwise negative except as noted above.  Physical Exam    VS:  BP 114/72 (BP Location: Left Arm, Patient Position: Sitting)   Pulse 65   Temp 97.8 F (36.6 C) (Temporal)   Ht 5\' 5"  (1.651 m)   Wt 158 lb (71.7 kg)   BMI 26.29 kg/m  , BMI Body mass index is 26.29 kg/m. GEN: Well nourished, well developed, in no acute distress. HEENT: normal. Neck: Supple, no JVD, carotid bruits, or masses. Cardiac: RRR, no murmurs, rubs, or gallops. No clubbing, cyanosis, edema.  Radials/DP/PT 2+ and equal bilaterally.  Respiratory:  Respirations regular and unlabored, clear to auscultation bilaterally. GI: Soft, nontender, nondistended, BS + x 4. MS: No deformity or atrophy. Skin: Warm and dry, no rash. Neuro:  Strength and sensation are intact. Psych: Normal affect.   Assessment & Plan    1. Atrial fibrillation -Initially caught on her cardia device.  She was initiated on flecainide, Xarelto and metoprolol by Dr. Bettina Norris approximately 2 weeks ago.  She reports she has had no recurrent episodes of atrial fibrillation however she endorses extreme fatigue and mood changes.  Anticipate this is caused by metoprolol despite very low dose.  Discontinue metoprolol.  She will take a 2-day holiday from her beta-blocker and flecainide and resume a Saturday morning with flecainide and new prescription for acebutolol 200 mg twice daily.  She questions if she could take just the Xarelto and no rate limiting nor antiarrhythmic medications.  We discussed that atrial fibrillation typically occurs Norris often as we age and would not recommend this  approach.  Reviewed her echo from today in detail -her Norris and left atrium are normal in size, normal EF -so I do not anticipate that she is in atrial fibrillation outside of the episodes that she has captured.  Continue anticoagulation with Xarelto 20 mg.  2. High risk medication use - Flecainide secondary to PAF. Flecainide level today. EKG today sinus rhythm 66 bpm with no QTC prolongation.  Previously rate controlled on metoprolol but with market fatigue-we will change rate control to acebutolol.  Plan for EKG and flecainide level every 6-12 months for monitoring.  3. Chronic anticoagulation - Secondary to PAF.  CHADS2VASC score of 2. Denies bleeding complications including hematuria and melena.  Continue Xarelto 20 mg daily.  Disposition: Telephone call in 2 weeks to check in. If improved on new beta blocker - Follow up in 6 month(s) with Dr. Bettina Norris.  If not, will schedule sooner follow up.   Loel Dubonnet, NP 04/20/2019, 2:05 PM

## 2019-04-20 ENCOUNTER — Ambulatory Visit: Payer: BLUE CROSS/BLUE SHIELD | Admitting: Family

## 2019-04-20 ENCOUNTER — Ambulatory Visit (HOSPITAL_BASED_OUTPATIENT_CLINIC_OR_DEPARTMENT_OTHER)
Admission: RE | Admit: 2019-04-20 | Discharge: 2019-04-20 | Disposition: A | Discharge status: Home / Self Care | Payer: Medicare Other | Source: Ambulatory Visit | Attending: Cardiology | Admitting: Cardiology

## 2019-04-20 ENCOUNTER — Other Ambulatory Visit: Payer: Self-pay

## 2019-04-20 ENCOUNTER — Encounter: Payer: Self-pay | Admitting: Family

## 2019-04-20 ENCOUNTER — Ambulatory Visit (INDEPENDENT_AMBULATORY_CARE_PROVIDER_SITE_OTHER): Payer: Medicare Other | Admitting: Family

## 2019-04-20 VITALS — BP 114/72 | HR 65 | Temp 97.8°F | Ht 65.0 in | Wt 158.0 lb

## 2019-04-20 DIAGNOSIS — I491 Atrial premature depolarization: Secondary | ICD-10-CM | POA: Diagnosis not present

## 2019-04-20 DIAGNOSIS — R002 Palpitations: Secondary | ICD-10-CM

## 2019-04-20 DIAGNOSIS — E785 Hyperlipidemia, unspecified: Secondary | ICD-10-CM | POA: Diagnosis not present

## 2019-04-20 DIAGNOSIS — I480 Paroxysmal atrial fibrillation: Secondary | ICD-10-CM | POA: Diagnosis not present

## 2019-04-20 DIAGNOSIS — I340 Nonrheumatic mitral (valve) insufficiency: Secondary | ICD-10-CM | POA: Diagnosis not present

## 2019-04-20 DIAGNOSIS — I4891 Unspecified atrial fibrillation: Secondary | ICD-10-CM | POA: Insufficient documentation

## 2019-04-20 DIAGNOSIS — Z8679 Personal history of other diseases of the circulatory system: Secondary | ICD-10-CM | POA: Insufficient documentation

## 2019-04-20 MED ORDER — ACEBUTOLOL HCL 200 MG PO CAPS
200.0000 mg | ORAL_CAPSULE | Freq: Two times a day (BID) | ORAL | 2 refills | Status: DC
Start: 2019-04-20 — End: 2019-05-05

## 2019-04-20 NOTE — Progress Notes (Signed)
  Echocardiogram 2D Echocardiogram has been performed.  Connie Norris 04/20/2019, 10:58 AM

## 2019-04-20 NOTE — Patient Instructions (Addendum)
Medication Instructions:  Your physician has recommended you make the following change in your medication:   CONTINUE Xarelto  STOP Metoprolol  START Acebutolol 200mg  (one tablet) twice daily  Hold flecainide and beta blocker until Saturday morning, then resume.  If you need a refill on your cardiac medications before your next appointment, please call your pharmacy.   Lab work: Your physician recommends that you return for lab work today: Flecainide level  If you have labs (blood work) drawn today and your tests are completely normal, you will receive your results only by: Marland Kitchen MyChart Message (if you have MyChart) OR . A paper copy in the mail If you have any lab test that is abnormal or we need to change your treatment, we will call you to review the results.  Testing/Procedures: You had an EKG today. You were in sinus rhythm and your QTc was normal.  Follow-Up: At [facility], you and your health needs are our priority.  As part of our continuing mission to provide you with exceptional heart care, we have created designated Provider Care Teams.  These Care Teams include your primary Cardiologist (physician) and Advanced Practice Providers (APPs -  Physician Assistants and Nurse Practitioners) who all work together to provide you with the care you need, when you need it. . Follow up with Dr. Bettina Gavia in 6 months   Any Other Special Instructions Will Be Listed Below (If Applicable).    Atrial Fibrillation  Atrial fibrillation is a type of heartbeat that is irregular or fast (rapid). If you have this condition, your heart beats without any order. This makes it hard for your heart to pump blood in a normal way. Having this condition gives you more risk for stroke, heart failure, and other heart problems. Atrial fibrillation may start all of a sudden and then stop on its own, or it may become a long-lasting problem. What are the causes? This condition may be caused by heart  conditions, such as:  High blood pressure.  Heart failure.  Heart valve disease.  Heart surgery. Other causes include:  Pneumonia.  Obstructive sleep apnea.  Lung cancer.  Thyroid disease.  Drinking too much alcohol. Sometimes the cause is not known. What increases the risk? You are more likely to develop this condition if:  You smoke.  You are older.  You have diabetes.  You are overweight.  You have a family history of this condition.  You exercise often and hard. What are the signs or symptoms? Common symptoms of this condition include:  A feeling like your heart is beating very fast.  Chest pain.  Feeling short of breath.  Feeling light-headed or weak.  Getting tired easily. Follow these instructions at home: Medicines  Take over-the-counter and prescription medicines only as told by your doctor.  If your doctor gives you a blood-thinning medicine, take it exactly as told. Taking too much of it can cause bleeding. Taking too little of it does not protect you against clots. Clots can cause a stroke. Lifestyle      Do not use any tobacco products. These include cigarettes, chewing tobacco, and e-cigarettes. If you need help quitting, ask your doctor.  Do not drink alcohol.  Do not drink beverages that have caffeine. These include coffee, soda, and tea.  Follow diet instructions as told by your doctor.  Exercise regularly as told by your doctor. General instructions  If you have a condition that causes breathing to stop for a short period of time (  apnea), treat it as told by your doctor.  Keep a healthy weight. Do not use diet pills unless your doctor says they are safe for you. Diet pills may make heart problems worse.  Keep all follow-up visits as told by your doctor. This is important. Contact a doctor if:  You notice a change in the speed, rhythm, or strength of your heartbeat.  You are taking a blood-thinning medicine and you see  more bruising.  You get tired more easily when you move or exercise.  You have a sudden change in weight. Get help right away if:   You have pain in your chest or your belly (abdomen).  You have trouble breathing.  You have blood in your vomit, poop, or pee (urine).  You have any signs of a stroke. "BE FAST" is an easy way to remember the main warning signs: ? B - Balance. Signs are dizziness, sudden trouble walking, or loss of balance. ? E - Eyes. Signs are trouble seeing or a change in how you see. ? F - Face. Signs are sudden weakness or loss of feeling in the face, or the face or eyelid drooping on one side. ? A - Arms. Signs are weakness or loss of feeling in an arm. This happens suddenly and usually on one side of the body. ? S - Speech. Signs are sudden trouble speaking, slurred speech, or trouble understanding what people say. ? T - Time. Time to call emergency services. Write down what time symptoms started.  You have other signs of a stroke, such as: ? A sudden, very bad headache with no known cause. ? Feeling sick to your stomach (nausea). ? Throwing up (vomiting). ? Jerky movements you cannot control (seizure). These symptoms may be an emergency. Do not wait to see if the symptoms will go away. Get medical help right away. Call your local emergency services (911 in the [place] Do not drive yourself to the hospital. Summary  Atrial fibrillation is a type of heartbeat that is irregular or fast (rapid).  You are at higher risk of this condition if you smoke, are older, have diabetes, or are overweight.  Follow your doctor's instructions about medicines, diet, exercise, and follow-up visits.  Get help right away if you think that you have signs of a stroke. This information is not intended to replace advice given to you by your health care provider. Make sure you discuss any questions you have with your health care provider. Document Released: 04/01/2008 Document Revised:  08/27/2017 Document Reviewed: 08/14/2017 Elsevier Patient Education  2020 [employer].

## 2019-04-21 ENCOUNTER — Ambulatory Visit: Payer: BLUE CROSS/BLUE SHIELD | Admitting: Cardiology

## 2019-04-22 ENCOUNTER — Ambulatory Visit: Payer: BLUE CROSS/BLUE SHIELD | Admitting: Family

## 2019-04-23 LAB — FLECAINIDE LEVEL: Flecainide: 0.25 ug/mL (ref 0.20–1.00)

## 2019-04-26 ENCOUNTER — Telehealth: Payer: Self-pay

## 2019-04-26 NOTE — Telephone Encounter (Signed)
Left voice message on cell phone # (per DPR) informing that flecainide level in normal range. Requested return call to let us know if fatigue has improved since medication changed from metorpolol to acebutolol.

## 2019-05-03 DIAGNOSIS — N3000 Acute cystitis without hematuria: Secondary | ICD-10-CM | POA: Diagnosis not present

## 2019-05-05 ENCOUNTER — Telehealth: Payer: Self-pay | Admitting: Family

## 2019-05-05 DIAGNOSIS — I480 Paroxysmal atrial fibrillation: Secondary | ICD-10-CM

## 2019-05-05 MED ORDER — FLECAINIDE ACETATE 50 MG PO TABS: 50.0000 mg | ORAL_TABLET | Freq: Two times a day (BID) | ORAL | 1 refills | Status: DC

## 2019-05-05 MED ORDER — RIVAROXABAN 20 MG PO TABS: 20.0000 mg | ORAL_TABLET | Freq: Every day | ORAL | 1 refills | Status: DC

## 2019-05-05 MED ORDER — ACEBUTOLOL HCL 200 MG PO CAPS
200.0000 mg | ORAL_CAPSULE | Freq: Two times a day (BID) | ORAL | 1 refills | Status: DC
Start: 2019-05-05 — End: 2019-07-11

## 2019-05-05 NOTE — Telephone Encounter (Signed)
Called to check on her after switch from Metoprolol to Acebutolol. She reports feeling well with this change. No recurrent fatigue.   She has in the interim been treated for a UTI. Discussed that this is not side effect of Acebutolol.   She has bursitis in her right shoulder. She is going to an accupuncturist. Questions what she can take over the counter for pain. Recommended Tylenol. Advised against Advil, Aleve, ibuprofen.   She may continue her over the counter tumeric and fish oil. Refills were provided of her Xarelto, Flecainide, Acebutolol to get her to her f/u in 6 months.   Loel Dubonnet, NP

## 2019-05-19 ENCOUNTER — Other Ambulatory Visit: Payer: Self-pay | Admitting: Family

## 2019-05-19 DIAGNOSIS — I480 Paroxysmal atrial fibrillation: Secondary | ICD-10-CM

## 2019-05-19 DIAGNOSIS — M7541 Impingement syndrome of right shoulder: Secondary | ICD-10-CM | POA: Diagnosis not present

## 2019-06-01 ENCOUNTER — Other Ambulatory Visit: Payer: Self-pay

## 2019-06-01 DIAGNOSIS — I480 Paroxysmal atrial fibrillation: Secondary | ICD-10-CM

## 2019-06-01 MED ORDER — FLECAINIDE ACETATE 50 MG PO TABS: 50.0000 mg | ORAL_TABLET | Freq: Two times a day (BID) | ORAL | 1 refills | Status: DC

## 2019-06-08 DIAGNOSIS — Z23 Encounter for immunization: Secondary | ICD-10-CM | POA: Diagnosis not present

## 2019-06-23 ENCOUNTER — Telehealth: Payer: Self-pay | Admitting: Cardiology

## 2019-06-23 NOTE — Telephone Encounter (Signed)
error 

## 2019-07-11 ENCOUNTER — Telehealth: Payer: Self-pay | Admitting: Cardiology

## 2019-07-11 ENCOUNTER — Other Ambulatory Visit: Payer: Self-pay | Admitting: *Deleted

## 2019-07-11 DIAGNOSIS — I480 Paroxysmal atrial fibrillation: Secondary | ICD-10-CM

## 2019-07-11 MED ORDER — ACEBUTOLOL HCL 200 MG PO CAPS
200.0000 mg | ORAL_CAPSULE | Freq: Two times a day (BID) | ORAL | 1 refills | Status: DC
Start: 2019-07-11 — End: 2020-04-27

## 2019-07-11 MED ORDER — ACEBUTOLOL HCL 200 MG PO CAPS: 200.0000 mg | ORAL_CAPSULE | Freq: Two times a day (BID) | ORAL | 1 refills | Status: DC

## 2019-07-11 MED ORDER — RIVAROXABAN 20 MG PO TABS: 20.0000 mg | ORAL_TABLET | Freq: Every day | ORAL | 1 refills | Status: DC

## 2019-07-11 MED ORDER — FLECAINIDE ACETATE 50 MG PO TABS: 50.0000 mg | ORAL_TABLET | Freq: Two times a day (BID) | ORAL | 1 refills | Status: DC

## 2019-07-11 NOTE — Telephone Encounter (Signed)
Call acebutolol, xarelto and flecinide to [place] [facility]

## 2019-07-11 NOTE — Telephone Encounter (Signed)
Refill sent to [facility]

## 2019-07-18 DIAGNOSIS — M7541 Impingement syndrome of right shoulder: Secondary | ICD-10-CM | POA: Insufficient documentation

## 2019-07-19 DIAGNOSIS — M25511 Pain in right shoulder: Secondary | ICD-10-CM | POA: Diagnosis not present

## 2019-07-19 DIAGNOSIS — M7541 Impingement syndrome of right shoulder: Secondary | ICD-10-CM | POA: Diagnosis not present

## 2019-07-27 DIAGNOSIS — M7541 Impingement syndrome of right shoulder: Secondary | ICD-10-CM | POA: Diagnosis not present

## 2019-08-10 DIAGNOSIS — Z4789 Encounter for other orthopedic aftercare: Secondary | ICD-10-CM | POA: Insufficient documentation

## 2019-08-15 ENCOUNTER — Encounter: Payer: Self-pay | Admitting: Family

## 2019-08-16 ENCOUNTER — Telehealth: Payer: Self-pay | Admitting: Family

## 2019-08-16 DIAGNOSIS — Z7901 Long term (current) use of anticoagulants: Secondary | ICD-10-CM

## 2019-08-16 DIAGNOSIS — I480 Paroxysmal atrial fibrillation: Secondary | ICD-10-CM

## 2019-08-16 NOTE — Telephone Encounter (Signed)
   Primary Cardiologist: Shirlee More, MD  Patient was contacted 08/16/2019 in reference to pre-operative risk assessment for pending surgery as outlined below.  Connie Norris was last seen on 04/20/2019 by Laurann Montana, NP.  Since that day, Connie Norris has done well. She reports no anginal symptoms. She had one possible fleeting episode of atrial fibrillation, but no recurrent episodes. She exercises regularly and has an exercise tolerance of >4 METS.  She has upcoming shoulder surgery with Dr. Stann Mainland of [facility] on 09/12/19.   Therefore, based on ACC/AHA guidelines, the patient would be at acceptable risk for the planned procedure without further cardiovascular testing.   She has a creatinine clearance of 60 and may hold Xarelto 3 days prior to planned procedure per office protocol and per her primary cardiologist Dr. Bettina Gavia. She will resume Xarelto at the instruction of her surgeon. She verbalized understanding of these directions. She will continue Flecainide and Acebutolol during the pre/peri/post operative period.   I will route this recommendation to the requesting party via Epic fax function.   Please call with questions.  Loel Dubonnet, NP 08/16/2019, 11:05 AM

## 2019-08-19 DIAGNOSIS — Z23 Encounter for immunization: Secondary | ICD-10-CM | POA: Diagnosis not present

## 2019-09-08 ENCOUNTER — Encounter: Payer: Self-pay | Admitting: Family

## 2019-09-16 DIAGNOSIS — Z23 Encounter for immunization: Secondary | ICD-10-CM | POA: Diagnosis not present

## 2019-09-26 DIAGNOSIS — M24111 Other articular cartilage disorders, right shoulder: Secondary | ICD-10-CM | POA: Diagnosis not present

## 2019-09-26 DIAGNOSIS — G8918 Other acute postprocedural pain: Secondary | ICD-10-CM | POA: Diagnosis not present

## 2019-09-26 DIAGNOSIS — M19011 Primary osteoarthritis, right shoulder: Secondary | ICD-10-CM | POA: Diagnosis not present

## 2019-09-26 DIAGNOSIS — M7521 Bicipital tendinitis, right shoulder: Secondary | ICD-10-CM | POA: Diagnosis not present

## 2019-09-26 DIAGNOSIS — M7541 Impingement syndrome of right shoulder: Secondary | ICD-10-CM | POA: Diagnosis not present

## 2019-09-26 DIAGNOSIS — M7551 Bursitis of right shoulder: Secondary | ICD-10-CM | POA: Diagnosis not present

## 2019-09-26 DIAGNOSIS — M75121 Complete rotator cuff tear or rupture of right shoulder, not specified as traumatic: Secondary | ICD-10-CM | POA: Diagnosis not present

## 2019-09-27 ENCOUNTER — Ambulatory Visit: Payer: Medicare Other | Admitting: Cardiology

## 2019-09-30 DIAGNOSIS — N3000 Acute cystitis without hematuria: Secondary | ICD-10-CM | POA: Diagnosis not present

## 2019-10-06 DIAGNOSIS — Z0000 Encounter for general adult medical examination without abnormal findings: Secondary | ICD-10-CM | POA: Diagnosis not present

## 2019-10-06 DIAGNOSIS — Z90710 Acquired absence of both cervix and uterus: Secondary | ICD-10-CM | POA: Diagnosis not present

## 2019-10-06 DIAGNOSIS — E663 Overweight: Secondary | ICD-10-CM | POA: Diagnosis not present

## 2019-10-06 DIAGNOSIS — Z79899 Other long term (current) drug therapy: Secondary | ICD-10-CM | POA: Diagnosis not present

## 2019-10-06 DIAGNOSIS — Z6829 Body mass index (BMI) 29.0-29.9, adult: Secondary | ICD-10-CM | POA: Diagnosis not present

## 2019-10-06 DIAGNOSIS — Z13820 Encounter for screening for osteoporosis: Secondary | ICD-10-CM | POA: Diagnosis not present

## 2019-10-06 DIAGNOSIS — I480 Paroxysmal atrial fibrillation: Secondary | ICD-10-CM | POA: Diagnosis not present

## 2019-10-10 ENCOUNTER — Other Ambulatory Visit: Payer: Self-pay

## 2019-10-10 DIAGNOSIS — M79621 Pain in right upper arm: Secondary | ICD-10-CM | POA: Diagnosis not present

## 2019-10-10 DIAGNOSIS — M25511 Pain in right shoulder: Secondary | ICD-10-CM | POA: Diagnosis not present

## 2019-10-10 DIAGNOSIS — M62521 Muscle wasting and atrophy, not elsewhere classified, right upper arm: Secondary | ICD-10-CM | POA: Diagnosis not present

## 2019-10-10 DIAGNOSIS — M4003 Postural kyphosis, cervicothoracic region: Secondary | ICD-10-CM | POA: Diagnosis not present

## 2019-10-11 ENCOUNTER — Encounter: Payer: Self-pay | Admitting: Cardiology

## 2019-10-11 ENCOUNTER — Other Ambulatory Visit: Payer: Self-pay

## 2019-10-11 ENCOUNTER — Ambulatory Visit (INDEPENDENT_AMBULATORY_CARE_PROVIDER_SITE_OTHER): Payer: Medicare Other | Admitting: Cardiology

## 2019-10-11 VITALS — BP 118/70 | HR 68 | Temp 97.4°F | Ht 64.5 in | Wt 173.2 lb

## 2019-10-11 DIAGNOSIS — I480 Paroxysmal atrial fibrillation: Secondary | ICD-10-CM | POA: Diagnosis not present

## 2019-10-11 DIAGNOSIS — Z7901 Long term (current) use of anticoagulants: Secondary | ICD-10-CM | POA: Diagnosis not present

## 2019-10-11 DIAGNOSIS — E782 Mixed hyperlipidemia: Secondary | ICD-10-CM

## 2019-10-11 DIAGNOSIS — Z79899 Other long term (current) drug therapy: Secondary | ICD-10-CM | POA: Diagnosis not present

## 2019-10-11 NOTE — Progress Notes (Signed)
Cardiology Office Note:    Date:  10/11/2019   ID:  Connie Norris, Connie Norris [birth year], MRN [id number]  PCP:  [name], MD  Cardiologist:  Shirlee More, MD    Referring MD: [name], MD    ASSESSMENT:    1. PAF (paroxysmal atrial fibrillation) ([place])   2. Chronic anticoagulation   3. High risk medication use   4. Mixed hyperlipidemia    PLAN:    In order of problems listed above:  1. PAF remains symptomatic breakthrough episodes despite antiarrhythmic drug referral for consultation and consideration of ablation 2. Continue her current anticoagulant 3. Continue flecainide no evidence of toxicity 4. Recheck lipids July LDL remains elevated would benefit from a statin.  If she is statin has not cardiac coronary calcium score is beneficial and if 0 would reclassify risk and would justify not taking lipid-lowering treatment   Next appointment: 6 months   Medication Adjustments/Labs and Tests Ordered: Current medicines are reviewed at length with the patient today.  Concerns regarding medicines are outlined above.  Orders Placed This Encounter  Procedures  . Lipid Profile  . Ambulatory referral to Cardiac Electrophysiology   No orders of the defined types were placed in this encounter.   Chief Complaint  Patient presents with  . Follow-up  . Atrial Fibrillation    History of Present Illness:    Connie Norris is a 64 y.o. female with a hx of palpitation and possible PAF on Kardia recordings last seen 04/06/2019.  At that visit I reviewed her tracings which showed clear-cut atrial fibrillation with rapid rates up to 100 4250 bpm and the longest episode lasting more than 1 hour.  At that time aspirin was discontinued she was initiated on anticoagulation and antiarrhythmic drug therapy with flecainide.  Echocardiogram 04/20/2019 showed normal left ventricular size and function without hypertrophy EF 60 to 65% both atria were normal in size and there is mild mitral  regurgitation. Compliance with diet, lifestyle and medications: Yes  She continues to have documented atrial fibrillation using the iPhone adapter, alive cor and is displeased with how she feels on flecainide.  She has gained weight with her antiarrhythmic drug and beta-blocker and wishes to be seen by EP to consider ablation as alternative therapy.  She tolerates her anticoagulant without bleeding complication.  She shows me labs with a cholesterol 254 HDL 77 LDL 154 triglycerides 114 on 10/07/2019  with mild elevation of transaminases normal CBC and normal renal function.  She has had rotator cuff repair surgery and wants to recheck lipids after recovery before excepting lipid-lowering treatment. Past Medical History:  Diagnosis Date  . Atrial fibrillation ([place])   . Hyperlipidemia 12/14/2018  . PONV (postoperative nausea and vomiting)     Past Surgical History:  Procedure Laterality Date  . ABDOMINAL HYSTERECTOMY    . [name] OSTEOTOMY Right 06/04/2017   Procedure: [employer];  Surgeon: [name], MD;  Location: [place];  Service: Orthopedics;  Laterality: Right;  . CESAREAN SECTION    . METATARSAL OSTEOTOMY WITH BUNIONECTOMY Right 06/04/2017   Procedure: Right 1st metatarsal scarf osteotomy, modified McBride bunionectomy and  Akin osteotomy;  Surgeon: [name], MD;  Location: [place];  Service: Orthopedics;  Laterality: Right;  . WRIST FRACTURE SURGERY     1999 left wrist    Current Medications: Current Meds  Medication Sig  . acebutolol (SECTRAL) 200 MG capsule Take 1 capsule (200 mg total) by mouth 2 (two) times daily.  Marland Kitchen  B Complex Vitamins (PA B-COMPLEX WITH B-12 PO) Take 1 mL by mouth every morning.  Marland Kitchen co-enzyme Q-10 30 MG capsule Take 30 mg by mouth daily.   . flecainide (TAMBOCOR) 50 MG tablet Take 1 tablet (50 mg total) by mouth 2 (two) times daily.  . rivaroxaban (XARELTO) 20 MG TABS tablet Take 1 tablet (20 mg total) by mouth daily  with supper.  . thiamine (VITAMIN B-1) 100 MG tablet Take 100 mg by mouth daily.  . TURMERIC PO Take 1 capsule by mouth daily.     Allergies:   Other and Penicillins   Social History   Socioeconomic History  . Marital status: Married    Spouse name: Not on file  . Number of children: Not on file  . Years of education: Not on file  . Highest education level: Not on file  Occupational History  . Not on file  Tobacco Use  . Smoking status: Former Smoker    Types: Cigarettes    Quit date: 2008    Years since quitting: 13.2  . Smokeless tobacco: Never Used  Substance and Sexual Activity  . Alcohol use: Yes    Alcohol/week: 2.0 standard drinks    Types: 2 Glasses of wine per week    Comment: red wine 2 glasses per week   . Drug use: No  . Sexual activity: Not on file  Other Topics Concern  . Not on file  Social History Narrative  . Not on file   Social Determinants of Health   Financial Resource Strain:   . Difficulty of Paying Living Expenses:   Food Insecurity:   . Worried About Charity fundraiser in the Last Year:   . Arboriculturist in the Last Year:   Transportation Needs:   . Film/video editor (Medical):   Marland Kitchen Lack of Transportation (Non-Medical):   Physical Activity:   . Days of Exercise per Week:   . Minutes of Exercise per Session:   Stress:   . Feeling of Stress :   Social Connections:   . Frequency of Communication with Friends and Family:   . Frequency of Social Gatherings with Friends and Family:   . Attends Religious Services:   . Active Member of Clubs or Organizations:   . Attends Archivist Meetings:   Marland Kitchen Marital Status:      Family History: The patient's family history includes COPD in her brother and father; Cancer in her mother; Heart attack in her father and mother; Heart disease in her maternal grandmother; Parkinson's disease in her sister. ROS:   Please see the history of present illness.    All other systems reviewed and  are negative.  EKGs/Labs/Other Studies Reviewed:    The following studies were reviewed today:  EKG: I reviewed approximately 20 rhythm strips from her iPhone with documented recurrent atrial fibrillation rates in the 140 -150 bpm  Physical Exam:    VS:  BP 118/70   Pulse 68   Temp (!) 97.4 F (36.3 C)   Ht 5' 4.5" (1.638 m)   Wt 173 lb 3.2 oz (78.6 kg)   SpO2 97%   BMI 29.27 kg/m     Wt Readings from Last 3 Encounters:  10/11/19 173 lb 3.2 oz (78.6 kg)  04/20/19 158 lb (71.7 kg)  04/06/19 157 lb 1.9 oz (71.3 kg)     GEN:  Well nourished, well developed in no acute distress HEENT: Normal NECK: No JVD; No carotid  bruits LYMPHATICS: No lymphadenopathy CARDIAC: RRR, no murmurs, rubs, gallops RESPIRATORY:  Clear to auscultation without rales, wheezing or rhonchi  ABDOMEN: Soft, non-tender, non-distended MUSCULOSKELETAL:  No edema; No deformity  SKIN: Warm and dry NEUROLOGIC:  Alert and oriented x 3 PSYCHIATRIC:  Normal affect    Signed, Shirlee More, MD  10/11/2019 2:56 PM    [facility]

## 2019-10-11 NOTE — Patient Instructions (Addendum)
Medication Instructions:  Your physician recommends that you continue on your current medications as directed. Please refer to the Current Medication list given to you today.  *If you need a refill on your cardiac medications before your next appointment, please call your pharmacy*   Lab Work: Your physician recommends that you return for lab work in: 3 months  Lipid Panel If you have labs (blood work) drawn today and your tests are completely normal, you will receive your results only by: Marland Kitchen MyChart Message (if you have MyChart) OR . A paper copy in the mail If you have any lab test that is abnormal or we need to change your treatment, we will call you to review the results.   Testing/Procedures: None   Follow-Up: At [facility], you and your health needs are our priority.  As part of our continuing mission to provide you with exceptional heart care, we have created designated Provider Care Teams.  These Care Teams include your primary Cardiologist (physician) and Advanced Practice Providers (APPs -  Physician Assistants and Nurse Practitioners) who all work together to provide you with the care you need, when you need it.  We recommend signing up for the patient portal called "MyChart".  Sign up information is provided on this After Visit Summary.  MyChart is used to connect with patients for Virtual Visits (Telemedicine).  Patients are able to view lab/test results, encounter notes, upcoming appointments, etc.  Non-urgent messages can be sent to your provider as well.   To learn more about what you can do with MyChart, go to [email or web address].    Your next appointment:   After seeing Dr Curt Bears  The format for your next appointment:   In Person  Provider:   Dr. Curt Bears   Other Instructions We have put in a referral for you to see the electrophysiology Dr.  Deborha Payment will call you to schedule this appointment.

## 2019-10-14 DIAGNOSIS — M62521 Muscle wasting and atrophy, not elsewhere classified, right upper arm: Secondary | ICD-10-CM | POA: Diagnosis not present

## 2019-10-14 DIAGNOSIS — M79621 Pain in right upper arm: Secondary | ICD-10-CM | POA: Diagnosis not present

## 2019-10-14 DIAGNOSIS — M4003 Postural kyphosis, cervicothoracic region: Secondary | ICD-10-CM | POA: Diagnosis not present

## 2019-10-14 DIAGNOSIS — M25511 Pain in right shoulder: Secondary | ICD-10-CM | POA: Diagnosis not present

## 2019-10-19 DIAGNOSIS — M79621 Pain in right upper arm: Secondary | ICD-10-CM | POA: Diagnosis not present

## 2019-10-19 DIAGNOSIS — M25511 Pain in right shoulder: Secondary | ICD-10-CM | POA: Diagnosis not present

## 2019-10-19 DIAGNOSIS — M4003 Postural kyphosis, cervicothoracic region: Secondary | ICD-10-CM | POA: Diagnosis not present

## 2019-10-19 DIAGNOSIS — M62521 Muscle wasting and atrophy, not elsewhere classified, right upper arm: Secondary | ICD-10-CM | POA: Diagnosis not present

## 2019-10-26 DIAGNOSIS — M25511 Pain in right shoulder: Secondary | ICD-10-CM | POA: Diagnosis not present

## 2019-10-26 DIAGNOSIS — M79621 Pain in right upper arm: Secondary | ICD-10-CM | POA: Diagnosis not present

## 2019-10-26 DIAGNOSIS — M62521 Muscle wasting and atrophy, not elsewhere classified, right upper arm: Secondary | ICD-10-CM | POA: Diagnosis not present

## 2019-10-26 DIAGNOSIS — M4003 Postural kyphosis, cervicothoracic region: Secondary | ICD-10-CM | POA: Diagnosis not present

## 2019-11-02 DIAGNOSIS — M62521 Muscle wasting and atrophy, not elsewhere classified, right upper arm: Secondary | ICD-10-CM | POA: Diagnosis not present

## 2019-11-02 DIAGNOSIS — M79621 Pain in right upper arm: Secondary | ICD-10-CM | POA: Diagnosis not present

## 2019-11-02 DIAGNOSIS — M25511 Pain in right shoulder: Secondary | ICD-10-CM | POA: Diagnosis not present

## 2019-11-02 DIAGNOSIS — M4003 Postural kyphosis, cervicothoracic region: Secondary | ICD-10-CM | POA: Diagnosis not present

## 2019-11-09 DIAGNOSIS — M62521 Muscle wasting and atrophy, not elsewhere classified, right upper arm: Secondary | ICD-10-CM | POA: Diagnosis not present

## 2019-11-09 DIAGNOSIS — M4003 Postural kyphosis, cervicothoracic region: Secondary | ICD-10-CM | POA: Diagnosis not present

## 2019-11-09 DIAGNOSIS — M79621 Pain in right upper arm: Secondary | ICD-10-CM | POA: Diagnosis not present

## 2019-11-09 DIAGNOSIS — M25511 Pain in right shoulder: Secondary | ICD-10-CM | POA: Diagnosis not present

## 2019-11-11 DIAGNOSIS — M79621 Pain in right upper arm: Secondary | ICD-10-CM | POA: Diagnosis not present

## 2019-11-11 DIAGNOSIS — M4003 Postural kyphosis, cervicothoracic region: Secondary | ICD-10-CM | POA: Diagnosis not present

## 2019-11-11 DIAGNOSIS — M62521 Muscle wasting and atrophy, not elsewhere classified, right upper arm: Secondary | ICD-10-CM | POA: Diagnosis not present

## 2019-11-11 DIAGNOSIS — M25511 Pain in right shoulder: Secondary | ICD-10-CM | POA: Diagnosis not present

## 2019-11-16 DIAGNOSIS — M4003 Postural kyphosis, cervicothoracic region: Secondary | ICD-10-CM | POA: Diagnosis not present

## 2019-11-16 DIAGNOSIS — M25511 Pain in right shoulder: Secondary | ICD-10-CM | POA: Diagnosis not present

## 2019-11-16 DIAGNOSIS — M62521 Muscle wasting and atrophy, not elsewhere classified, right upper arm: Secondary | ICD-10-CM | POA: Diagnosis not present

## 2019-11-16 DIAGNOSIS — M79621 Pain in right upper arm: Secondary | ICD-10-CM | POA: Diagnosis not present

## 2019-11-18 DIAGNOSIS — M62521 Muscle wasting and atrophy, not elsewhere classified, right upper arm: Secondary | ICD-10-CM | POA: Diagnosis not present

## 2019-11-18 DIAGNOSIS — M4003 Postural kyphosis, cervicothoracic region: Secondary | ICD-10-CM | POA: Diagnosis not present

## 2019-11-18 DIAGNOSIS — M25511 Pain in right shoulder: Secondary | ICD-10-CM | POA: Diagnosis not present

## 2019-11-18 DIAGNOSIS — M79621 Pain in right upper arm: Secondary | ICD-10-CM | POA: Diagnosis not present

## 2019-11-22 DIAGNOSIS — M62521 Muscle wasting and atrophy, not elsewhere classified, right upper arm: Secondary | ICD-10-CM | POA: Diagnosis not present

## 2019-11-22 DIAGNOSIS — M4003 Postural kyphosis, cervicothoracic region: Secondary | ICD-10-CM | POA: Diagnosis not present

## 2019-11-22 DIAGNOSIS — M79621 Pain in right upper arm: Secondary | ICD-10-CM | POA: Diagnosis not present

## 2019-11-22 DIAGNOSIS — M25511 Pain in right shoulder: Secondary | ICD-10-CM | POA: Diagnosis not present

## 2019-11-24 DIAGNOSIS — M79621 Pain in right upper arm: Secondary | ICD-10-CM | POA: Diagnosis not present

## 2019-11-24 DIAGNOSIS — M62521 Muscle wasting and atrophy, not elsewhere classified, right upper arm: Secondary | ICD-10-CM | POA: Diagnosis not present

## 2019-11-24 DIAGNOSIS — M25511 Pain in right shoulder: Secondary | ICD-10-CM | POA: Diagnosis not present

## 2019-11-24 DIAGNOSIS — M4003 Postural kyphosis, cervicothoracic region: Secondary | ICD-10-CM | POA: Diagnosis not present

## 2019-11-29 ENCOUNTER — Other Ambulatory Visit: Payer: Self-pay | Admitting: Cardiology

## 2019-11-29 DIAGNOSIS — I480 Paroxysmal atrial fibrillation: Secondary | ICD-10-CM

## 2019-11-29 DIAGNOSIS — M62521 Muscle wasting and atrophy, not elsewhere classified, right upper arm: Secondary | ICD-10-CM | POA: Diagnosis not present

## 2019-11-29 DIAGNOSIS — M4003 Postural kyphosis, cervicothoracic region: Secondary | ICD-10-CM | POA: Diagnosis not present

## 2019-11-29 DIAGNOSIS — M25511 Pain in right shoulder: Secondary | ICD-10-CM | POA: Diagnosis not present

## 2019-11-29 DIAGNOSIS — M79621 Pain in right upper arm: Secondary | ICD-10-CM | POA: Diagnosis not present

## 2019-12-01 DIAGNOSIS — M25511 Pain in right shoulder: Secondary | ICD-10-CM | POA: Diagnosis not present

## 2019-12-01 DIAGNOSIS — M4003 Postural kyphosis, cervicothoracic region: Secondary | ICD-10-CM | POA: Diagnosis not present

## 2019-12-01 DIAGNOSIS — M62521 Muscle wasting and atrophy, not elsewhere classified, right upper arm: Secondary | ICD-10-CM | POA: Diagnosis not present

## 2019-12-01 DIAGNOSIS — M79621 Pain in right upper arm: Secondary | ICD-10-CM | POA: Diagnosis not present

## 2019-12-07 DIAGNOSIS — M62521 Muscle wasting and atrophy, not elsewhere classified, right upper arm: Secondary | ICD-10-CM | POA: Diagnosis not present

## 2019-12-07 DIAGNOSIS — M25511 Pain in right shoulder: Secondary | ICD-10-CM | POA: Diagnosis not present

## 2019-12-07 DIAGNOSIS — M4003 Postural kyphosis, cervicothoracic region: Secondary | ICD-10-CM | POA: Diagnosis not present

## 2019-12-07 DIAGNOSIS — M79621 Pain in right upper arm: Secondary | ICD-10-CM | POA: Diagnosis not present

## 2019-12-14 DIAGNOSIS — M62521 Muscle wasting and atrophy, not elsewhere classified, right upper arm: Secondary | ICD-10-CM | POA: Diagnosis not present

## 2019-12-14 DIAGNOSIS — M79621 Pain in right upper arm: Secondary | ICD-10-CM | POA: Diagnosis not present

## 2019-12-14 DIAGNOSIS — M25511 Pain in right shoulder: Secondary | ICD-10-CM | POA: Diagnosis not present

## 2019-12-14 DIAGNOSIS — M4003 Postural kyphosis, cervicothoracic region: Secondary | ICD-10-CM | POA: Diagnosis not present

## 2019-12-21 DIAGNOSIS — M25511 Pain in right shoulder: Secondary | ICD-10-CM | POA: Diagnosis not present

## 2019-12-21 DIAGNOSIS — M62521 Muscle wasting and atrophy, not elsewhere classified, right upper arm: Secondary | ICD-10-CM | POA: Diagnosis not present

## 2019-12-21 DIAGNOSIS — M79621 Pain in right upper arm: Secondary | ICD-10-CM | POA: Diagnosis not present

## 2019-12-21 DIAGNOSIS — M4003 Postural kyphosis, cervicothoracic region: Secondary | ICD-10-CM | POA: Diagnosis not present

## 2020-01-02 ENCOUNTER — Ambulatory Visit: Payer: Medicare Other | Admitting: Cardiology

## 2020-01-21 NOTE — Progress Notes (Signed)
Cardiology Office Note:    Date:  01/23/2020   ID:  Connie Norris, DOB April 17, 1956, MRN [id number]  PCP:  [name], MD  Cardiologist:  Shirlee More, MD    Referring MD: [name], MD    ASSESSMENT:    1. PAF (paroxysmal atrial fibrillation) ([place])   2. Chronic anticoagulation   3. High risk medication use    PLAN:    In order of problems listed above:  1. Although she is well controlled with flecainide beta-blocker and anticoagulant she is very displeased with the way she feels weight gain has made her mind up to pursue EP ablation think she is an exceptionally good candidate but I asked her to remain on her current regimen until she sees EP and told her she probably take the antiarrhythmic drug for the first 3 months and a decision about anticoagulant will be individualized after she recovers. 2. And and edema I suspect this is venous insufficiency check proBNP level if severely elevated may require diuretic 3. Flecainide level her EKG shows no evidence of toxicity   Next appointment: I will see her afterwards after she recovers from her EP procedure   Medication Adjustments/Labs and Tests Ordered: Current medicines are reviewed at length with the patient today.  Concerns regarding medicines are outlined above.  No orders of the defined types were placed in this encounter.  No orders of the defined types were placed in this encounter.   Chief Complaint  Patient presents with  . Follow-up    History of Present Illness:    Connie Norris is a 64 y.o. female with a hx of palpitation and possible PAF on [place] recordings last seen 04/06/2019.  At that visit I reviewed her tracings which showed clear-cut atrial fibrillation with rapid rates greater than 100 bpm and the longest episode lasting more than 1 hour.  At that time aspirin was discontinued she was initiated on anticoagulation and antiarrhythmic drug therapy with flecainide.  Echocardiogram 04/20/2019 showed  normal left ventricular size and function without hypertrophy EF 60 to 65% both atria were normal in size and there is mild mitral regurgitation.  She was last seen 10/11/2019 having breakthrough episodes of atrial fibrillation and I recommended referral to electrophysiology regarding pulmonary vein isolation. Compliance with diet, lifestyle and medications: Yes  She is done well with flecainide she has had no breakthrough episodes of atrial fibrillation. She tolerates her anticoagulant without bleeding. Unfortunately she is very displeased with the weight gain approaching 30 pounds and would really like to be off her antiarrhythmic drug and beta-blocker and said she thinks is the cause. I told her that I cannot draw a line between her side effects and the medications but I think she is a good candidate for EP ablation with very low atrial fibrillation burden young age healthy and little structural changes of her heart and she has an appointment for evaluation on the 29th. That day will check labs flat fast including a lipid profile she is concerned about dependent edema proBNP level and a flecainide level her EKG today Past Medical History:  Diagnosis Date  . Atrial fibrillation ([place])   . Hyperlipidemia 12/14/2018  . PONV (postoperative nausea and vomiting)     Past Surgical History:  Procedure Laterality Date  . ABDOMINAL HYSTERECTOMY    . [name] OSTEOTOMY Right 06/04/2017   Procedure: [employer];  Surgeon: [name], MD;  Location: [facility];  Service: Orthopedics;  Laterality: Right;  . CESAREAN SECTION    .  METATARSAL OSTEOTOMY WITH BUNIONECTOMY Right 06/04/2017   Procedure: Right 1st metatarsal scarf osteotomy, modified McBride bunionectomy and  Akin osteotomy;  Surgeon: [name], MD;  Location: [facility];  Service: Orthopedics;  Laterality: Right;  . WRIST FRACTURE SURGERY     1999 left wrist    Current Medications: Current Meds  Medication  Sig  . acebutolol (SECTRAL) 200 MG capsule Take 1 capsule (200 mg total) by mouth 2 (two) times daily.  . B Complex Vitamins (PA B-COMPLEX WITH B-12 PO) Take 1 mL by mouth every morning.  Marland Kitchen co-enzyme Q-10 30 MG capsule Take 30 mg by mouth daily.   . flecainide (TAMBOCOR) 50 MG tablet TAKE 1 TABLET BY MOUTH TWICE DAILY  . rivaroxaban (XARELTO) 20 MG TABS tablet Take 1 tablet (20 mg total) by mouth daily with supper.  . TURMERIC PO Take 1 capsule by mouth daily.     Allergies:   Other and Penicillins   Social History   Socioeconomic History  . Marital status: Married    Spouse name: Not on file  . Number of children: Not on file  . Years of education: Not on file  . Highest education level: Not on file  Occupational History  . Not on file  Tobacco Use  . Smoking status: Former Smoker    Types: Cigarettes    Quit date: 2008    Years since quitting: 13.5  . Smokeless tobacco: Never Used  Vaping Use  . Vaping Use: Some days  . Start date: 02/24/2014  Substance and Sexual Activity  . Alcohol use: Yes    Alcohol/week: 2.0 standard drinks    Types: 2 Glasses of wine per week    Comment: red wine 2 glasses per week   . Drug use: No  . Sexual activity: Not on file  Other Topics Concern  . Not on file  Social History Narrative  . Not on file   Social Determinants of Health   Financial Resource Strain:   . Difficulty of Paying Living Expenses:   Food Insecurity:   . Worried About Charity fundraiser in the Last Year:   . Arboriculturist in the Last Year:   Transportation Needs:   . Film/video editor (Medical):   Marland Kitchen Lack of Transportation (Non-Medical):   Physical Activity:   . Days of Exercise per Week:   . Minutes of Exercise per Session:   Stress:   . Feeling of Stress :   Social Connections:   . Frequency of Communication with Friends and Family:   . Frequency of Social Gatherings with Friends and Family:   . Attends Religious Services:   . Active Member of  Clubs or Organizations:   . Attends Archivist Meetings:   Marland Kitchen Marital Status:      Family History: The patient's family history includes COPD in her brother and father; Cancer in her mother; Heart attack in her father and mother; Heart disease in her maternal grandmother; Parkinson's disease in her sister. ROS:   Please see the history of present illness.    All other systems reviewed and are negative.  EKGs/Labs/Other Studies Reviewed:    The following studies were reviewed today:  Recent Labs:  cholesterol 254 HDL 77 LDL 154 triglycerides 114 on 10/07/2019  with mild elevation of transaminases normal CBC and normal renal function.   Physical Exam:    VS:  BP 140/78 (BP Location: Right Arm, Patient Position: Sitting, Cuff Size:  Normal)   Pulse 60   Ht 5' 4.5" (1.638 m)   Wt 177 lb (80.3 kg)   SpO2 99%   BMI 29.91 kg/m     Wt Readings from Last 3 Encounters:  01/23/20 177 lb (80.3 kg)  10/11/19 173 lb 3.2 oz (78.6 kg)  04/20/19 158 lb (71.7 kg)     GEN:  Well nourished, well developed in no acute distress HEENT: Normal NECK: No JVD; No carotid bruits LYMPHATICS: No lymphadenopathy CARDIAC: RRR, no murmurs, rubs, gallops RESPIRATORY:  Clear to auscultation without rales, wheezing or rhonchi  ABDOMEN: Soft, non-tender, non-distended MUSCULOSKELETAL:  No edema; No deformity  SKIN: Warm and dry NEUROLOGIC:  Alert and oriented x 3 PSYCHIATRIC:  Normal affect    Signed, Shirlee More, MD  01/23/2020 2:44 PM    [place]

## 2020-01-23 ENCOUNTER — Ambulatory Visit (INDEPENDENT_AMBULATORY_CARE_PROVIDER_SITE_OTHER): Payer: Medicare Other | Admitting: Cardiology

## 2020-01-23 ENCOUNTER — Other Ambulatory Visit: Payer: Self-pay

## 2020-01-23 ENCOUNTER — Ambulatory Visit: Payer: Medicare Other | Admitting: Cardiology

## 2020-01-23 ENCOUNTER — Encounter: Payer: Self-pay | Admitting: Cardiology

## 2020-01-23 VITALS — BP 140/78 | HR 60 | Ht 64.5 in | Wt 177.0 lb

## 2020-01-23 DIAGNOSIS — I480 Paroxysmal atrial fibrillation: Secondary | ICD-10-CM | POA: Diagnosis not present

## 2020-01-23 DIAGNOSIS — Z79899 Other long term (current) drug therapy: Secondary | ICD-10-CM | POA: Diagnosis not present

## 2020-01-23 DIAGNOSIS — Z7901 Long term (current) use of anticoagulants: Secondary | ICD-10-CM | POA: Diagnosis not present

## 2020-01-23 NOTE — Patient Instructions (Addendum)
Medication Instructions:  Your physician recommends that you continue on your current medications as directed. Please refer to the Current Medication list given to you today.  *If you need a refill on your cardiac medications before your next appointment, please call your pharmacy*   Lab Work: None If you have labs (blood work) drawn today and your tests are completely normal, you will receive your results only by: Marland Kitchen MyChart Message (if you have MyChart) OR . A paper copy in the mail If you have any lab test that is abnormal or we need to change your treatment, we will call you to review the results.   Testing/Procedures: None   Follow-Up: At [facility], you and your health needs are our priority.  As part of our continuing mission to provide you with exceptional heart care, we have created designated Provider Care Teams.  These Care Teams include your primary Cardiologist (physician) and Advanced Practice Providers (APPs -  Physician Assistants and Nurse Practitioners) who all work together to provide you with the care you need, when you need it.  We recommend signing up for the patient portal called "MyChart".  Sign up information is provided on this After Visit Summary.  MyChart is used to connect with patients for Virtual Visits (Telemedicine).  Patients are able to view lab/test results, encounter notes, upcoming appointments, etc.  Non-urgent messages can be sent to your provider as well.   To learn more about what you can do with MyChart, go to [email or web address].    Your next appointment:   As needed  The format for your next appointment:   In Person  Provider:   Shirlee More, MD   Other Instructions Please have them draw labs and complete an EKG when you go to see Dr. Curt Bears on the 29th.

## 2020-01-24 DIAGNOSIS — R928 Other abnormal and inconclusive findings on diagnostic imaging of breast: Secondary | ICD-10-CM | POA: Diagnosis not present

## 2020-01-24 DIAGNOSIS — Z1231 Encounter for screening mammogram for malignant neoplasm of breast: Secondary | ICD-10-CM | POA: Diagnosis not present

## 2020-01-31 ENCOUNTER — Other Ambulatory Visit: Payer: Self-pay | Admitting: Family

## 2020-01-31 DIAGNOSIS — I480 Paroxysmal atrial fibrillation: Secondary | ICD-10-CM

## 2020-01-31 DIAGNOSIS — M7541 Impingement syndrome of right shoulder: Secondary | ICD-10-CM | POA: Diagnosis not present

## 2020-02-02 ENCOUNTER — Encounter: Payer: Self-pay | Admitting: Cardiology

## 2020-02-02 ENCOUNTER — Ambulatory Visit (INDEPENDENT_AMBULATORY_CARE_PROVIDER_SITE_OTHER): Payer: Medicare Other | Admitting: Cardiology

## 2020-02-02 ENCOUNTER — Other Ambulatory Visit: Payer: Self-pay

## 2020-02-02 VITALS — BP 130/80 | HR 118 | Ht 64.5 in | Wt 173.6 lb

## 2020-02-02 DIAGNOSIS — R0602 Shortness of breath: Secondary | ICD-10-CM | POA: Diagnosis not present

## 2020-02-02 DIAGNOSIS — I480 Paroxysmal atrial fibrillation: Secondary | ICD-10-CM | POA: Diagnosis not present

## 2020-02-02 DIAGNOSIS — E782 Mixed hyperlipidemia: Secondary | ICD-10-CM

## 2020-02-02 DIAGNOSIS — R002 Palpitations: Secondary | ICD-10-CM | POA: Diagnosis not present

## 2020-02-02 DIAGNOSIS — Z79899 Other long term (current) drug therapy: Secondary | ICD-10-CM | POA: Diagnosis not present

## 2020-02-02 NOTE — Patient Instructions (Addendum)
Medication Instructions:  Your physician recommends that you continue on your current medications as directed. Please refer to the Current Medication list given to you today.  *If you need a refill on your cardiac medications before your next appointment, please call your pharmacy*   Lab Work: TODAY - lipid panel, pro bnp    Testing/Procedures: None Ordered    Follow-Up: At [facility], you and your health needs are our priority.  As part of our continuing mission to provide you with exceptional heart care, we have created designated Provider Care Teams.  These Care Teams include your primary Cardiologist (physician) and Advanced Practice Providers (APPs -  Physician Assistants and Nurse Practitioners) who all work together to provide you with the care you need, when you need it.  **We will be in touch regarding potential dates for your A Fib ablation and appropriate timing of pre-procedure appointment with Dr. Curt Bears and lab work

## 2020-02-02 NOTE — Progress Notes (Signed)
Electrophysiology Office Note   Date:  02/02/2020   ID:  [name], [name] 1956/06/17, MRN [id number]  PCP:  [name], MD  Cardiologist:  Bettina Gavia Primary Electrophysiologist:  Jeffree Cazeau Connie Leeds, MD    Chief Complaint: AF   History of Present Illness: Connie Norris is a 64 y.o. female who is being seen today for the evaluation of AF at the request of Bettina Gavia, Connie Cork, MD. Presenting today for electrophysiology evaluation.  She has a history of atrial fibrillation and hyper lipidemia.  She is currently on flecainide and Eliquis.  She had an echo done that showed a normal ejection fraction and atria with normal size.  She unfortunately has gained up to 30 pounds and would like to be off of her antiarrhythmic and beta-blocker as he thinks that this is the cause.  Today, she denies symptoms of chest pain, shortness of breath, orthopnea, PND, lower extremity edema, claudication, dizziness, presyncope, syncope, bleeding, or neurologic sequela. The patient is tolerating medications without difficulties.  She is in atrial fibrillation today.  She has symptoms of palpitations, fatigue, and shortness of breath.  She says that she has atrial fibrillation much less since starting flecainide, but does continue to have breakthrough episodes.   Past Medical History:  Diagnosis Date  . Atrial fibrillation ([place])   . Hyperlipidemia 12/14/2018  . PONV (postoperative nausea and vomiting)    Past Surgical History:  Procedure Laterality Date  . ABDOMINAL HYSTERECTOMY    . [name] OSTEOTOMY Right 06/04/2017   Procedure: [employer];  Surgeon: [name], MD;  Location: [facility];  Service: Orthopedics;  Laterality: Right;  . CESAREAN SECTION    . METATARSAL OSTEOTOMY WITH BUNIONECTOMY Right 06/04/2017   Procedure: Right 1st metatarsal scarf osteotomy, modified McBride bunionectomy and  Akin osteotomy;  Surgeon: [name], MD;  Location: [facility];  Service:  Orthopedics;  Laterality: Right;  . WRIST FRACTURE SURGERY     1999 left wrist     Current Outpatient Medications  Medication Sig Dispense Refill  . acebutolol (SECTRAL) 200 MG capsule Take 1 capsule (200 mg total) by mouth 2 (two) times daily. 180 capsule 1  . B Complex Vitamins (PA B-COMPLEX WITH B-12 PO) Take 1 mL by mouth every morning.    Marland Kitchen co-enzyme Q-10 30 MG capsule Take 30 mg by mouth daily.     . flecainide (TAMBOCOR) 50 MG tablet TAKE 1 TABLET BY MOUTH TWICE DAILY 180 tablet 1  . TURMERIC PO Take 1 capsule by mouth daily.    [name] 20 MG TABS tablet TAKE 1 TABLET BY MOUTH EVERY DAY WITH SUPPER 90 tablet 1   No current facility-administered medications for this visit.    Allergies:   Other and Penicillins   Social History:  The patient  reports that she quit smoking about 13 years ago. Her smoking use included cigarettes. She has never used smokeless tobacco. She reports current alcohol use of about 2.0 standard drinks of alcohol per week. She reports that she does not use drugs.   Family History:  The patient's family history includes COPD in her brother and father; Cancer in her mother; Heart attack in her father and mother; Heart disease in her maternal grandmother; Parkinson's disease in her sister.    ROS:  Please see the history of present illness.   Otherwise, review of systems is positive for none.   All other systems are reviewed and negative.    PHYSICAL EXAM: VS:  BP (!) 130/80   Pulse (!) 118   Ht 5' 4.5" (1.638 m)   Wt 173 lb 9.6 oz (78.7 kg)   SpO2 99%   BMI 29.34 kg/m  , BMI Body mass index is 29.34 kg/m. GEN: Well nourished, well developed, in no acute distress  HEENT: normal  Neck: no JVD, carotid bruits, or masses Cardiac: irregular you can say atrial fibrillation as well; no murmurs, rubs, or gallops,no edema  Respiratory:  clear to auscultation bilaterally, normal work of breathing GI: soft, nontender, nondistended, + BS MS: no deformity or  atrophy  Skin: warm and dry Neuro:  Strength and sensation are intact Psych: euthymic mood, full affect  EKG:  EKG is ordered today. Personal review of the ekg ordered shows atrial fibrillation, rate 118   Recent Labs: No results found for requested labs within last 8760 hours.    Lipid Panel  No results found for: CHOL, TRIG, HDL, CHOLHDL, VLDL, LDLCALC, LDLDIRECT   Wt Readings from Last 3 Encounters:  02/02/20 173 lb 9.6 oz (78.7 kg)  01/23/20 177 lb (80.3 kg)  10/11/19 173 lb 3.2 oz (78.6 kg)      Other studies Reviewed: Additional studies/ records that were reviewed today include: TTE 04/20/19  Review of the above records today demonstrates:  1. Left ventricular ejection fraction, by visual estimation, is 60 to  65%. The left ventricle has normal function. Normal left ventricular size.  There is no left ventricular hypertrophy.  2. Global right ventricle has normal systolic function.The right  ventricular size is normal. No increase in right ventricular wall  thickness.  3. Left atrial size was normal.  4. Right atrial size was normal.  5. The mitral valve is normal in structure. Mild mitral valve  regurgitation. No evidence of mitral stenosis.  6. The tricuspid valve is normal in structure. Tricuspid valve  regurgitation is trivial.  7. The aortic valve is normal in structure. Aortic valve regurgitation  was not visualized by color flow Doppler. Structurally normal aortic  valve, with no evidence of sclerosis or stenosis.  8. The pulmonic valve was normal in structure. Pulmonic valve  regurgitation is mild by color flow Doppler.  9. Normal pulmonary artery systolic pressure.  10. The inferior vena cava is normal in size with greater than 50%  respiratory variability, suggesting right atrial pressure of 3 mmHg.    ASSESSMENT AND PLAN:  1.  Paroxysmal atrial fibrillation: Currently on flecainide, acebutolol, Xarelto.  CHA2DS2-VASc of 2.  She has had  breakthrough atrial fibrillation despite her flecainide.  We Colinda Barth thus plan for ablation.  Risks and benefits were discussed risk of bleeding, tamponade, heart block, stroke, damage to chest organs such as the esophagus, vagal and phrenic nerves.  She understands these risks and is agreed to the procedure.  Case discussed with referring cardiologist  Current medicines are reviewed at length with the patient today.   The patient does not have concerns regarding her medicines.  The following changes were made today:  none  Labs/ tests ordered today include:  Orders Placed This Encounter  Procedures  . Pro b natriuretic peptide  . Lipid Profile  . EKG 12-Lead     Disposition:   FU with Tyrese Capriotti 3 months  Signed, Sherrina Zaugg Connie Leeds, MD  02/02/2020 9:13 AM     [facility] [address] [place] [address] [phone] (office) [phone] (fax)

## 2020-02-03 LAB — LIPID PANEL
Chol/HDL Ratio: 3.1 ratio (ref 0.0–4.4)
Cholesterol, Total: 267 mg/dL — ABNORMAL HIGH (ref 100–199)
HDL: 86 mg/dL (ref >39)
LDL Chol Calc (NIH): 162 mg/dL — ABNORMAL HIGH (ref 0–99)
Triglycerides: 110 mg/dL (ref 0–149)
VLDL Cholesterol Cal: 19 mg/dL (ref 5–40)

## 2020-02-03 LAB — PRO B NATRIURETIC PEPTIDE: NT-Pro BNP: 188 pg/mL (ref 0–301)

## 2020-02-07 ENCOUNTER — Telehealth: Payer: Self-pay

## 2020-02-07 DIAGNOSIS — I4811 Longstanding persistent atrial fibrillation: Secondary | ICD-10-CM

## 2020-02-07 DIAGNOSIS — Z01810 Encounter for preprocedural cardiovascular examination: Secondary | ICD-10-CM

## 2020-02-07 NOTE — Telephone Encounter (Signed)
-----   Message from Emmaline Life, RN sent at 02/02/2020  9:25 AM EDT ----- A fib ablation Offered her all sept dates. She would prefer a Monday or Friday, couldn't do 9/3. Advised her that you would be in contact w/October dates and then schedule return visit for h & p and labs.  Thanks!

## 2020-02-09 ENCOUNTER — Other Ambulatory Visit: Payer: Self-pay

## 2020-02-09 MED ORDER — METOPROLOL TARTRATE 100 MG PO TABS: ORAL_TABLET | ORAL | 0 refills | Status: DC

## 2020-02-09 NOTE — Telephone Encounter (Addendum)
Left message for the pt to call back re: her 05/04/20 7:30 am Ablation and her Cardiac CT prior.  Pt to have her COVID test 05/02/20 and she has an appt with Dr. Curt Bears in [place] for her labs and H&P  04/16/20 at 2:15pm.

## 2020-02-09 NOTE — Telephone Encounter (Addendum)
Spoke with the pt and she reports that she cannot have an in person OV with Dr. Curt Bears 04/16/20 for her H&P and labs in [place] since she will be traveling home that day. She can do a virtual visit that day at 4:15pm instead.   Since she lives in [place].. she is asking to have her labs at a local [place].  She is scheduled for her COVID test 05/01/20 in [place] but since it is an hour drive she is asking if she can have it at a local Urgent Care but I advised her I will need to forward to Dr. Curt Bears nurse for review and will have to let her know.    Letters have been mailed to the pt but there will be some changes RE: the above issues and there pt is aware.   Will forward to Sherri.

## 2020-02-09 NOTE — Telephone Encounter (Addendum)
After talking further with the pt... she was unsure that she could do all of the necessary appts for her Ablation and she is concerned about the recovery time in October so she is asking if she can have it done in November instead... I advised her that we will do what makes her most comfortable and we can change everythign to a November procedure date.. I advised her that I will forward to Dr. Macky Lower nurse, Venida Jarvis and she will get back in touch with her in the next couple of weeks and she was very appreciative for the time I spent talking with her.   [place], CT, precert, and the cath lab notified to cancel for now.

## 2020-02-14 NOTE — Telephone Encounter (Signed)
Gave Pt information to scheduling to set up follow up visit with Dr. Curt Bears in October to further discuss possible ablation procedure.

## 2020-02-15 ENCOUNTER — Telehealth: Payer: Self-pay | Admitting: Cardiology

## 2020-02-15 NOTE — Telephone Encounter (Signed)
New message    Pt has a question about a medication, a beta blocker. She said one was called in and then cancelled but it was in her meds that she picked. She would just like to confirm she does not take that medication.

## 2020-02-15 NOTE — Telephone Encounter (Signed)
Called and spoke to patient. She states that she went to pick up her refills from her pharmacy and metoprolol was given to her. She states that it was suppose to be d/c'd but they gave it to her by mistake and she wanted to make sure that she did not need to take it.   It looks like this was ordered when Cardiac CT was ordered. Metoprolol is no longer needed. Patient has postponed ablation and has been rescheduled to see Dr. Curt Bears on 10/25 to discuss moving forward with ablation. Patient states that she doesn't need to discuss moving forward with ablation as she has already decided to move forward with this process. She will just need to postpone having it done due to scheduling conflicts. She is questioning the need for follow up appointment and wants to know if she can just call later to schedule procedures. I have left her on the schedule for now and will forward to EP RNs for review and recommendation. Patient thanked me for the call.

## 2020-02-16 NOTE — Telephone Encounter (Signed)
See separate MyChart message received from Pt and response.

## 2020-02-17 ENCOUNTER — Telehealth: Payer: Self-pay

## 2020-02-17 NOTE — Telephone Encounter (Signed)
-----   Message from Berniece Salines, DO sent at 02/14/2020  4:41 PM EDT ----- Your LDL is elevated 162, total cholesterol 267; I would like to start you on Crestor 10 mg daily and repeat lab in 8 weeks.

## 2020-02-17 NOTE — Telephone Encounter (Signed)
Left message on patients voicemail to please return our call.   

## 2020-02-20 ENCOUNTER — Telehealth: Payer: Self-pay

## 2020-02-20 DIAGNOSIS — R7989 Other specified abnormal findings of blood chemistry: Secondary | ICD-10-CM

## 2020-02-20 DIAGNOSIS — Z5181 Encounter for therapeutic drug level monitoring: Secondary | ICD-10-CM

## 2020-02-20 DIAGNOSIS — E7800 Pure hypercholesterolemia, unspecified: Secondary | ICD-10-CM

## 2020-02-20 MED ORDER — ROSUVASTATIN CALCIUM 10 MG PO TABS: 10.0000 mg | ORAL_TABLET | Freq: Every day | ORAL | 3 refills | Status: DC

## 2020-02-20 NOTE — Telephone Encounter (Signed)
Left message on patients voicemail to please return our call.   

## 2020-02-20 NOTE — Telephone Encounter (Signed)
-----   Message from Berniece Salines, DO sent at 02/14/2020  4:41 PM EDT ----- Your LDL is elevated 162, total cholesterol 267; I would like to start you on Crestor 10 mg daily and repeat lab in 8 weeks.

## 2020-03-05 ENCOUNTER — Telehealth: Payer: Self-pay | Admitting: Cardiology

## 2020-03-05 ENCOUNTER — Telehealth: Payer: Self-pay

## 2020-03-05 NOTE — Telephone Encounter (Signed)
Patient received a letter in regards to lab results. She is returning call. Patient states to call mobile number or you can contact her through [employer]

## 2020-03-05 NOTE — Telephone Encounter (Signed)
Spoke with pt who states that she does not want to start a statin. Pt was advised to start a statin after her last labs reviewed by Dr. Harriet Masson. Pt states that she had recently had surgery and feels that is why her lipids were elevated. She would like to have her labs redone in a few weeks to see if they have improved since she is active again. How do you advise?

## 2020-03-05 NOTE — Telephone Encounter (Signed)
See previous note

## 2020-03-19 ENCOUNTER — Other Ambulatory Visit: Payer: Self-pay | Admitting: Family Medicine

## 2020-03-19 DIAGNOSIS — R928 Other abnormal and inconclusive findings on diagnostic imaging of breast: Secondary | ICD-10-CM | POA: Diagnosis not present

## 2020-03-19 DIAGNOSIS — N6489 Other specified disorders of breast: Secondary | ICD-10-CM | POA: Diagnosis not present

## 2020-04-02 ENCOUNTER — Ambulatory Visit
Admission: RE | Admit: 2020-04-02 | Discharge: 2020-04-02 | Disposition: A | Discharge status: Home / Self Care | Payer: Medicare Other | Source: Ambulatory Visit | Attending: Family Medicine | Admitting: Family Medicine

## 2020-04-02 ENCOUNTER — Other Ambulatory Visit: Payer: Self-pay

## 2020-04-02 DIAGNOSIS — R928 Other abnormal and inconclusive findings on diagnostic imaging of breast: Secondary | ICD-10-CM

## 2020-04-02 DIAGNOSIS — N6312 Unspecified lump in the right breast, upper inner quadrant: Secondary | ICD-10-CM | POA: Diagnosis not present

## 2020-04-02 DIAGNOSIS — N6011 Diffuse cystic mastopathy of right breast: Secondary | ICD-10-CM | POA: Diagnosis not present

## 2020-04-16 ENCOUNTER — Ambulatory Visit: Payer: Medicare Other | Admitting: Cardiology

## 2020-04-16 ENCOUNTER — Telehealth: Payer: Medicare Other | Admitting: Cardiology

## 2020-04-24 ENCOUNTER — Telehealth: Payer: Self-pay | Admitting: Cardiology

## 2020-04-24 DIAGNOSIS — I4819 Other persistent atrial fibrillation: Secondary | ICD-10-CM

## 2020-04-24 NOTE — Telephone Encounter (Signed)
Patient states she is returning a call to Dr. Macky Lower nurse regarding procedure on 05/18/20. She states call was received on 04/20/20.

## 2020-04-27 DIAGNOSIS — I480 Paroxysmal atrial fibrillation: Secondary | ICD-10-CM

## 2020-04-27 MED ORDER — ACEBUTOLOL HCL 200 MG PO CAPS: 200.0000 mg | ORAL_CAPSULE | Freq: Two times a day (BID) | ORAL | 1 refills | Status: DC

## 2020-04-27 NOTE — Telephone Encounter (Signed)
Left detailed message on cell phone, dpr on file. Informed that we would further go over instructions at [place] Monday. Made aware that I would go ahead and generate instructions letters & place orders. Asked her to call office this afternoon, or send mychart message, if she needed to further discuss anything.

## 2020-04-30 ENCOUNTER — Other Ambulatory Visit: Payer: Self-pay

## 2020-04-30 ENCOUNTER — Telehealth: Payer: Self-pay

## 2020-04-30 ENCOUNTER — Encounter: Payer: Self-pay | Admitting: Cardiology

## 2020-04-30 ENCOUNTER — Ambulatory Visit (INDEPENDENT_AMBULATORY_CARE_PROVIDER_SITE_OTHER): Payer: Medicare Other | Admitting: Cardiology

## 2020-04-30 VITALS — BP 110/68 | Ht 64.5 in | Wt 173.0 lb

## 2020-04-30 DIAGNOSIS — Z01812 Encounter for preprocedural laboratory examination: Secondary | ICD-10-CM | POA: Diagnosis not present

## 2020-04-30 DIAGNOSIS — I480 Paroxysmal atrial fibrillation: Secondary | ICD-10-CM

## 2020-04-30 DIAGNOSIS — Z79899 Other long term (current) drug therapy: Secondary | ICD-10-CM

## 2020-04-30 DIAGNOSIS — Z7901 Long term (current) use of anticoagulants: Secondary | ICD-10-CM

## 2020-04-30 LAB — BASIC METABOLIC PANEL
BUN/Creatinine Ratio: 23 (ref 12–28)
BUN: 21 mg/dL (ref 8–27)
CO2: 22 mmol/L (ref 20–29)
Calcium: 9.5 mg/dL (ref 8.7–10.3)
Chloride: 106 mmol/L (ref 96–106)
Creatinine, Ser: 0.92 mg/dL (ref 0.57–1.00)
GFR calc Af Amer: 75 mL/min/{1.73_m2} (ref >59)
GFR calc non Af Amer: 65 mL/min/{1.73_m2} (ref >59)
Glucose: 83 mg/dL (ref 65–99)
Potassium: 4.4 mmol/L (ref 3.5–5.2)
Sodium: 140 mmol/L (ref 134–144)

## 2020-04-30 LAB — CBC
Hematocrit: 40.8 % (ref 34.0–46.6)
Hemoglobin: 13.7 g/dL (ref 11.1–15.9)
MCH: 30.7 pg (ref 26.6–33.0)
MCHC: 33.6 g/dL (ref 31.5–35.7)
MCV: 92 fL (ref 79–97)
Platelets: 255 10*3/uL (ref 150–450)
RBC: 4.46 x10E6/uL (ref 3.77–5.28)
RDW: 11.8 % (ref 11.7–15.4)
WBC: 6.5 10*3/uL (ref 3.4–10.8)

## 2020-04-30 NOTE — Patient Instructions (Signed)
Medication Instructions:  Your physician recommends that you continue on your current medications as directed. Please refer to the Current Medication list given to you today.  *If you need a refill on your cardiac medications before your next appointment, please call your pharmacy*   Lab Work: Pre procedure labs today: BMET & CBC If you have labs (blood work) drawn today and your tests are completely normal, you will receive your results only by: Marland Kitchen MyChart Message (if you have MyChart) OR . A paper copy in the mail If you have any lab test that is abnormal or we need to change your treatment, we will call you to review the results.   Testing/Procedures: Your physician has requested that you have cardiac CT. Cardiac computed tomography (CT) is a painless test that uses an x-ray machine to take clear, detailed pictures of your heart. Please follow the instructions located below under "other instructions".  Your physician has recommended that you have an ablation. Catheter ablation is a medical procedure used to treat some cardiac arrhythmias (irregular heartbeats). During catheter ablation, a long, thin, flexible tube is put into a blood vessel in your groin (upper thigh), or neck. This tube is called an ablation catheter. It is then guided to your heart through the blood vessel. Radio frequency waves destroy small areas of heart tissue where abnormal heartbeats may cause an arrhythmia to start. Please follow the instructions located below under "other instructions".   Follow-Up: At [facility], you and your health needs are our priority.  As part of our continuing mission to provide you with exceptional heart care, we have created designated Provider Care Teams.  These Care Teams include your primary Cardiologist (physician) and Advanced Practice Providers (APPs -  Physician Assistants and Nurse Practitioners) who all work together to provide you with the care you need, when you need it.  We  recommend signing up for the patient portal called "MyChart".  Sign up information is provided on this After Visit Summary.  MyChart is used to connect with patients for Virtual Visits (Telemedicine).  Patients are able to view lab/test results, encounter notes, upcoming appointments, etc.  Non-urgent messages can be sent to your provider as well.   To learn more about what you can do with MyChart, go to [email or web address].    Your next appointment:   4 week(s) after your ablation on 05/18/20  The format for your next appointment:   In Person  Provider:   You will follow up in the [facility] located at [facility]. Your provider will be: Roderic Palau, NP or Clint R. Fenton, PA-C   Thank you for choosing [facility]!!   Trinidad Curet, RN [phone]   Other Instructions  CT INSTRUCTIONS Your cardiac CT will be scheduled at:  [facility] [address] [place],  [address] [phone]  Please arrive at the [facility] main entrance of [facility] 30 minutes prior to test start time. Proceed to the [facility] Radiology Department (first floor) to check-in and test prep.  Please follow these instructions carefully (unless otherwise directed):  On the Night Before the Test: . Be sure to Drink plenty of water. . Do not consume any caffeinated/decaffeinated beverages or chocolate 12 hours prior to your test. . Do not take any antihistamines 12 hours prior to your test.  On the Day of the Test: . Drink plenty of water. Do not drink any water within one hour of the test. .  Do not eat any food 4 hours prior to the test. . You may take your regular medications prior to the test.  . HOLD Furosemide/Hydrochlorothiazide morning of the test. . FEMALES- please wear underwire-free bra if available      After the Test: . Drink plenty of water. . After receiving IV contrast, you may experience a mild flushed feeling. This  is normal. . On occasion, you may experience a mild rash up to 24 hours after the test. This is not dangerous. If this occurs, you can take Benadryl 25 mg and increase your fluid intake. . If you experience trouble breathing, this can be serious. If it is severe call 911 IMMEDIATELY. If it is mild, please call our office. . If you take any of these medications: Glipizide/Metformin, Avandament, Glucavance, please do not take 48 hours after completing test unless otherwise instructed.   Once we have confirmed authorization from your insurance company, we will call you to set up a date and time for your test. Based on how quickly your insurance processes prior authorizations requests, please allow up to 4 weeks to be contacted for scheduling your Cardiac CT appointment. Be advised that routine Cardiac CT appointments could be scheduled as many as 8 weeks after your provider has ordered it.  For non-scheduling related questions, please contact the cardiac imaging nurse navigator should you have any questions/concerns: Marchia Bond, Cardiac Imaging Nurse Navigator Burley Saver, Interim Cardiac Imaging Nurse [place] [facility] Direct Office Dial: [phone]   For scheduling needs, including cancellations and rescheduling, please call Vivien Rota at [phone], option 3.      Electrophysiology/Ablation Procedure Instructions   You are scheduled for a(n)  ablation on 05/18/2020 with Dr. Aletha Halim Camnitz./   1.   Pre procedure testing-             A.  LAB WORK --- On 04/30/2020 for your pre procedure blood work.                 B. COVID TEST-- On 05/16/20 @ 8:00 am - This is a Drive Up Visit at [address] [place], [place] [address].  Someone will direct you to the appropriate testing line. Stay in your car and someone will be with you shortly.   After you are tested please go home and self quarantine until the day of your procedure.     2. On the day of your procedure  05/18/2020 you will go to [facility] [phone]) at 5:30 am.  Dennis Bast will go to the main entrance [address] [employer]) and enter where the [employer] are.  Your driver will drop you off and you will head down the hallway to ADMITTING.  You may have one support person come in to the hospital with you.  They will be asked to wait in the waiting room.   3.   Do not eat or drink after midnight prior to your procedure.   4.   Do not miss any doses of your blood thinner prior to the morning of your procedure or your procedure will need to be rescheduled.       Do NOT take any medications the morning of your procedure.   5.  Plan for an overnight stay, but you may be discharged home after your procedure.    If you use your phone frequently bring your phone charger, in case you have to stay.  If you are discharged  after your procedure you will need someone to drive you home and be with your for 24 hours after your procedure.   6. You will follow up with the AFIB clinic 4 weeks after your procedure.  You will follow up with Dr. Curt Bears  3 months after your procedure.  These appointments will be made for you.   * If you have ANY questions please call the office [phone] and ask for Raelle Chambers RN or send me a MyChart message   * Occasionally, EP Studies and ablations can become lengthy.  Please make your family aware of this before your procedure starts.  Average time ranges from 2-8 hours for EP studies/ablations.  Your physician will call your family after the procedure with the results.                                     Cardiac Ablation  Cardiac ablation is a procedure to stop some heart tissue from causing problems. The heart has many electrical connections. Sometimes these connections make the heart beat very fast or irregularly. Removing some problem areas can improve the heart rhythm or make it normal. What happens before the procedure?  Follow instructions from your doctor  about what you cannot eat or drink.  Ask your doctor about: ? Changing or stopping your normal medicines. This is important if you take diabetes medicines or blood thinners. ? Taking medicines such as aspirin and ibuprofen. These medicines can thin your blood. Do not take these medicines before your procedure if your doctor tells you not to.  Plan to have someone take you home.  If you will be going home right after the procedure, plan to have someone with you for 24 hours. What happens during the procedure?  To lower your risk of infection: ? Your health care team will wash or sanitize their hands. ? Your skin will be washed with soap. ? Hair may be removed from your neck or groin.  An IV tube will be put into one of your veins.  You will be given a medicine to help you relax (sedative).  Skin on your neck or groin will be numbed.  A cut (incision) will be made in your neck or groin.  A needle will be put through your cut and into a vein in your neck or groin.  A tube (catheter) will be put into the needle. The tube will be moved to your heart. X-rays (fluoroscopy) will be used to help guide the tube.  Small devices (electrodes) on the tip of the tube will send out electrical currents.  Dye may be put through the tube. This helps your surgeon see your heart.  Electrical energy will be used to scar (ablate) some heart tissue. Your surgeon may use: ? Heat (radiofrequency energy). ? Laser energy. ? Extreme cold (cryoablation).  The tube will be taken out.  Pressure will be held on your cut. This helps stop bleeding.  A bandage (dressing) will be put on your cut. The procedure may vary. What happens after the procedure?  You will be monitored until your medicines have worn off.  Your cut will be watched for bleeding. You will need to lie still for a few hours.  Do not drive for 24 hours or as long as your doctor tells you. Summary  Cardiac ablation is a procedure to  stop some heart tissue from causing problems.  Electrical energy will be used to scar (ablate) some heart tissue. This information is not intended to replace advice given to you by your health care provider. Make sure you discuss any questions you have with your health care provider. Document Revised: 06/05/2017 Document Reviewed: 05/12/2016 Elsevier Patient Education  2020 [employer].

## 2020-04-30 NOTE — Telephone Encounter (Signed)
Called and spoke with pt who states that she will not start a statin. Pt will have her lipids checked at her appointment with her PCP and have the records sent to our office. Pt states that she has started on a strict diet to lower her cholesterol. Pt advised to exercise and continue the diet she is on. Pt verbalized understanding and had no additional questions.

## 2020-04-30 NOTE — H&P (View-Only) (Signed)
Electrophysiology Office Note   Date:  04/30/2020   ID:  Cambell, Rickenbach 1955-12-14, MRN [id number]  PCP:  [name], MD  Cardiologist:  Bettina Gavia Primary Electrophysiologist:  Kaiyah Eber [name], MD    Chief Complaint: AF   History of Present Illness: Connie Norris who is being seen today for the evaluation of AF at the request of [name], MD. Presenting today for electrophysiology evaluation.  She has a history of atrial fibrillation and hyperlipidemia.  She is currently on Eliquis and flecainide.  She would like to be off of her antiarrhythmic.  She has had continued atrial fibrillation despite her flecainide use.  She has plans for AF ablation 05/18/2020.  Today, denies symptoms of palpitations, chest pain, shortness of breath, orthopnea, PND, lower extremity edema, claudication, dizziness, presyncope, syncope, bleeding, or neurologic sequela. The patient is tolerating medications without difficulties.  Since last being seen she has done well.  She continues to have short episodes of atrial fibrillation, but otherwise has no complaints.  She continues to be active, playing tennis.  She is ready for her ablation.   Past Medical History:  Diagnosis Date  . Atrial fibrillation ([place])   . Hyperlipidemia 12/14/2018  . PONV (postoperative nausea and vomiting)    Past Surgical History:  Procedure Laterality Date  . ABDOMINAL HYSTERECTOMY    . [name] OSTEOTOMY Right 06/04/2017   Procedure: [employer];  Surgeon: [name], MD;  Location: [facility];  Service: Orthopedics;  Laterality: Right;  . CESAREAN SECTION    . METATARSAL OSTEOTOMY WITH BUNIONECTOMY Right 06/04/2017   Procedure: Right 1st metatarsal scarf osteotomy, modified McBride bunionectomy and  Akin osteotomy;  Surgeon: [name], MD;  Location: [facility];  Service: Orthopedics;  Laterality: Right;  . WRIST FRACTURE SURGERY     1999 left wrist      Current Outpatient Medications  Medication Sig Dispense Refill  . acebutolol (SECTRAL) 200 MG capsule Take 1 capsule (200 mg total) by mouth 2 (two) times daily. 180 capsule 1  . B Complex Vitamins (PA B-COMPLEX WITH B-12 PO) Take 1 mL by mouth every morning.    Marland Kitchen co-enzyme Q-10 30 MG capsule Take 30 mg by mouth daily.     . flecainide (TAMBOCOR) 50 MG tablet TAKE 1 TABLET BY MOUTH TWICE DAILY 180 tablet 1  . TURMERIC PO Take 1 capsule by mouth daily.    [name] 20 MG TABS tablet TAKE 1 TABLET BY MOUTH EVERY DAY WITH SUPPER 90 tablet 1   No current facility-administered medications for this visit.    Allergies:   Other and Penicillins   Social History:  The patient  reports that she quit smoking about 13 years ago. Her smoking use included cigarettes. She has never used smokeless tobacco. She reports current alcohol use of about 2.0 standard drinks of alcohol per week. She reports that she does not use drugs.   Family History:  The patient's family history includes COPD in her brother and father; Cancer in her mother; Heart attack in her father and mother; Heart disease in her maternal grandmother; Parkinson's disease in her sister.   ROS:  Please see the history of present illness.   Otherwise, review of systems is positive for none.   All other systems are reviewed and negative.   PHYSICAL EXAM: VS:  BP 110/68   Ht 5' 4.5" (1.638 m)   Wt 173 lb (78.5 kg)   SpO2  96%   BMI 29.24 kg/m  , BMI Body mass index is 29.24 kg/m. GEN: Well nourished, well developed, in no acute distress  HEENT: normal  Neck: no JVD, carotid bruits, or masses Cardiac: RRR; no murmurs, rubs, or gallops,no edema  Respiratory:  clear to auscultation bilaterally, normal work of breathing GI: soft, nontender, nondistended, + BS MS: no deformity or atrophy  Skin: warm and dry Neuro:  Strength and sensation are intact Psych: euthymic mood, full affect  EKG:  EKG is ordered today. Personal review of  the ekg ordered shows sinus rhythm, rate 60   Recent Labs: 02/02/2020: NT-Pro BNP 188    Lipid Panel     Component Value Date/Time   CHOL 267 (H) 02/02/2020 0921   TRIG 110 02/02/2020 0921   HDL 86 02/02/2020 0921   CHOLHDL 3.1 02/02/2020 0921   LDLCALC 162 (H) 02/02/2020 0921     Wt Readings from Last 3 Encounters:  04/30/20 173 lb (78.5 kg)  02/02/20 173 lb 9.6 oz (78.7 kg)  01/23/20 177 lb (80.3 kg)      Other studies Reviewed: Additional studies/ records that were reviewed today include: TTE 04/20/19  Review of the above records today demonstrates:  1. Left ventricular ejection fraction, by visual estimation, is 60 to  65%. The left ventricle has normal function. Normal left ventricular size.  There is no left ventricular hypertrophy.  2. Global right ventricle has normal systolic function.The right  ventricular size is normal. No increase in right ventricular wall  thickness.  3. Left atrial size was normal.  4. Right atrial size was normal.  5. The mitral valve is normal in structure. Mild mitral valve  regurgitation. No evidence of mitral stenosis.  6. The tricuspid valve is normal in structure. Tricuspid valve  regurgitation is trivial.  7. The aortic valve is normal in structure. Aortic valve regurgitation  was not visualized by color flow Doppler. Structurally normal aortic  valve, with no evidence of sclerosis or stenosis.  8. The pulmonic valve was normal in structure. Pulmonic valve  regurgitation is mild by color flow Doppler.  9. Normal pulmonary artery systolic pressure.  10. The inferior vena cava is normal in size with greater than 50%  respiratory variability, suggesting right atrial pressure of 3 mmHg.    ASSESSMENT AND PLAN:  1.  Paroxysmal atrial fibrillation: Currently on flecainide, acebutolol, Xarelto.  CHA2DS2-VASc of 2.  She has plans for AF ablation 05/18/2020.  Risks and benefits of discussed.  Risk include bleeding, tamponade,  heart block, stroke, damage to chest organs.  She understands the risks and has agreed to the procedure.  She does hope to get off of her antiarrhythmic medications post procedure.  Current medicines are reviewed at length with the patient today.   The patient does not have concerns regarding her medicines.  The following changes were made today:  none  Labs/ tests ordered today include:  Orders Placed This Encounter  Procedures  . Basic metabolic panel  . CBC  . EKG 12-Lead     Disposition:   FU with Irlanda Croghan 3 months  Signed, Mazey Mantell [name], MD  04/30/2020 10:21 AM     [facility] [address] [place] [address] [phone] (office) [phone] (fax)

## 2020-04-30 NOTE — Progress Notes (Signed)
Electrophysiology Office Note   Date:  04/30/2020   ID:  Connie Norris, Connie Norris 04-10-1956, MRN [id number]  PCP:  [name], MD  Cardiologist:  Bettina Gavia Primary Electrophysiologist:  Mckensey Berghuis [name], MD    Chief Complaint: AF   History of Present Illness: Connie Norris is a 64 y.o. female who is being seen today for the evaluation of AF at the request of [name], MD. Presenting today for electrophysiology evaluation.  She has a history of atrial fibrillation and hyperlipidemia.  She is currently on Eliquis and flecainide.  She would like to be off of her antiarrhythmic.  She has had continued atrial fibrillation despite her flecainide use.  She has plans for AF ablation 05/18/2020.  Today, denies symptoms of palpitations, chest pain, shortness of breath, orthopnea, PND, lower extremity edema, claudication, dizziness, presyncope, syncope, bleeding, or neurologic sequela. The patient is tolerating medications without difficulties.  Since last being seen she has done well.  She continues to have short episodes of atrial fibrillation, but otherwise has no complaints.  She continues to be active, playing tennis.  She is ready for her ablation.   Past Medical History:  Diagnosis Date  . Atrial fibrillation ([place])   . Hyperlipidemia 12/14/2018  . PONV (postoperative nausea and vomiting)    Past Surgical History:  Procedure Laterality Date  . ABDOMINAL HYSTERECTOMY    . [name] OSTEOTOMY Right 06/04/2017   Procedure: [employer];  Surgeon: [name], MD;  Location: [place];  Service: Orthopedics;  Laterality: Right;  . CESAREAN SECTION    . METATARSAL OSTEOTOMY WITH BUNIONECTOMY Right 06/04/2017   Procedure: Right 1st metatarsal scarf osteotomy, modified McBride bunionectomy and  Akin osteotomy;  Surgeon: [name], MD;  Location: [facility];  Service: Orthopedics;  Laterality: Right;  . WRIST FRACTURE SURGERY     1999 left wrist      Current Outpatient Medications  Medication Sig Dispense Refill  . acebutolol (SECTRAL) 200 MG capsule Take 1 capsule (200 mg total) by mouth 2 (two) times daily. 180 capsule 1  . B Complex Vitamins (PA B-COMPLEX WITH B-12 PO) Take 1 mL by mouth every morning.    Marland Kitchen co-enzyme Q-10 30 MG capsule Take 30 mg by mouth daily.     . flecainide (TAMBOCOR) 50 MG tablet TAKE 1 TABLET BY MOUTH TWICE DAILY 180 tablet 1  . TURMERIC PO Take 1 capsule by mouth daily.    [name] 20 MG TABS tablet TAKE 1 TABLET BY MOUTH EVERY DAY WITH SUPPER 90 tablet 1   No current facility-administered medications for this visit.    Allergies:   Other and Penicillins   Social History:  The patient  reports that she quit smoking about 13 years ago. Her smoking use included cigarettes. She has never used smokeless tobacco. She reports current alcohol use of about 2.0 standard drinks of alcohol per week. She reports that she does not use drugs.   Family History:  The patient's family history includes COPD in her brother and father; Cancer in her mother; Heart attack in her father and mother; Heart disease in her maternal grandmother; Parkinson's disease in her sister.   ROS:  Please see the history of present illness.   Otherwise, review of systems is positive for none.   All other systems are reviewed and negative.   PHYSICAL EXAM: VS:  BP 110/68   Ht 5' 4.5" (1.638 m)   Wt 173 lb (78.5 kg)   SpO2  96%   BMI 29.24 kg/m  , BMI Body mass index is 29.24 kg/m. GEN: Well nourished, well developed, in no acute distress  HEENT: normal  Neck: no JVD, carotid bruits, or masses Cardiac: RRR; no murmurs, rubs, or gallops,no edema  Respiratory:  clear to auscultation bilaterally, normal work of breathing GI: soft, nontender, nondistended, + BS MS: no deformity or atrophy  Skin: warm and dry Neuro:  Strength and sensation are intact Psych: euthymic mood, full affect  EKG:  EKG is ordered today. Personal review of  the ekg ordered shows sinus rhythm, rate 60   Recent Labs: 02/02/2020: NT-Pro BNP 188    Lipid Panel     Component Value Date/Time   CHOL 267 (H) 02/02/2020 0921   TRIG 110 02/02/2020 0921   HDL 86 02/02/2020 0921   CHOLHDL 3.1 02/02/2020 0921   LDLCALC 162 (H) 02/02/2020 0921     Wt Readings from Last 3 Encounters:  04/30/20 173 lb (78.5 kg)  02/02/20 173 lb 9.6 oz (78.7 kg)  01/23/20 177 lb (80.3 kg)      Other studies Reviewed: Additional studies/ records that were reviewed today include: TTE 04/20/19  Review of the above records today demonstrates:  1. Left ventricular ejection fraction, by visual estimation, is 60 to  65%. The left ventricle has normal function. Normal left ventricular size.  There is no left ventricular hypertrophy.  2. Global right ventricle has normal systolic function.The right  ventricular size is normal. No increase in right ventricular wall  thickness.  3. Left atrial size was normal.  4. Right atrial size was normal.  5. The mitral valve is normal in structure. Mild mitral valve  regurgitation. No evidence of mitral stenosis.  6. The tricuspid valve is normal in structure. Tricuspid valve  regurgitation is trivial.  7. The aortic valve is normal in structure. Aortic valve regurgitation  was not visualized by color flow Doppler. Structurally normal aortic  valve, with no evidence of sclerosis or stenosis.  8. The pulmonic valve was normal in structure. Pulmonic valve  regurgitation is mild by color flow Doppler.  9. Normal pulmonary artery systolic pressure.  10. The inferior vena cava is normal in size with greater than 50%  respiratory variability, suggesting right atrial pressure of 3 mmHg.    ASSESSMENT AND PLAN:  1.  Paroxysmal atrial fibrillation: Currently on flecainide, acebutolol, Xarelto.  CHA2DS2-VASc of 2.  She has plans for AF ablation 05/18/2020.  Risks and benefits of discussed.  Risk include bleeding, tamponade,  heart block, stroke, damage to chest organs.  She understands the risks and has agreed to the procedure.  She does hope to get off of her antiarrhythmic medications post procedure.  Current medicines are reviewed at length with the patient today.   The patient does not have concerns regarding her medicines.  The following changes were made today:  none  Labs/ tests ordered today include:  Orders Placed This Encounter  Procedures  . Basic metabolic panel  . CBC  . EKG 12-Lead     Disposition:   FU with Dayten Juba 3 months  Signed, Jarae Panas [name], MD  04/30/2020 10:21 AM     [facility] [address] [place] [address] [phone] (office) [phone] (fax)

## 2020-05-01 ENCOUNTER — Other Ambulatory Visit (HOSPITAL_COMMUNITY): Payer: Medicare Other

## 2020-05-02 ENCOUNTER — Other Ambulatory Visit (HOSPITAL_COMMUNITY): Payer: Medicare Other

## 2020-05-04 ENCOUNTER — Ambulatory Visit (HOSPITAL_COMMUNITY): Admit: 2020-05-04 | Payer: Medicare Other | Admitting: Cardiology

## 2020-05-04 ENCOUNTER — Encounter (HOSPITAL_COMMUNITY): Payer: Self-pay

## 2020-05-04 SURGERY — ATRIAL FIBRILLATION ABLATION
Anesthesia: General

## 2020-05-10 ENCOUNTER — Telehealth (HOSPITAL_COMMUNITY): Payer: Self-pay | Admitting: Emergency Medicine

## 2020-05-10 NOTE — Telephone Encounter (Signed)
Attempted to call patient regarding upcoming cardiac CT appointment. °Left message on voicemail with name and callback number °Aryanna Shaver RN Navigator Cardiac Imaging °[facility] °[phone] Office °[phone] Cell ° °

## 2020-05-11 ENCOUNTER — Telehealth (HOSPITAL_COMMUNITY): Payer: Self-pay | Admitting: Emergency Medicine

## 2020-05-11 NOTE — Telephone Encounter (Signed)
Reaching out to patient to offer assistance regarding upcoming cardiac imaging study; pt verbalizes understanding of appt date/time, parking situation and where to check in, pre-test NPO status and medications ordered, and verified current allergies; name and call back number provided for further questions should they arise Connie Bond RN Navigator Cardiac Imaging [facility] [phone] office [phone] cell  Pt to take normal meds, states veins are very superficial

## 2020-05-14 ENCOUNTER — Other Ambulatory Visit: Payer: Self-pay

## 2020-05-14 ENCOUNTER — Ambulatory Visit (HOSPITAL_COMMUNITY)
Admission: RE | Admit: 2020-05-14 | Discharge: 2020-05-14 | Disposition: A | Discharge status: Home / Self Care | Payer: Medicare Other | Source: Ambulatory Visit | Attending: Cardiology | Admitting: Cardiology

## 2020-05-14 DIAGNOSIS — I4819 Other persistent atrial fibrillation: Secondary | ICD-10-CM | POA: Diagnosis not present

## 2020-05-14 MED ORDER — IOHEXOL 350 MG/ML SOLN
80.0000 mL | Freq: Once | INTRAVENOUS | Status: AC | PRN
  Administered 2020-05-14: 80 mL via INTRAVENOUS

## 2020-05-15 NOTE — Telephone Encounter (Signed)
[facility] is calling due to not receiving a response back. Please advise.

## 2020-05-16 ENCOUNTER — Other Ambulatory Visit (HOSPITAL_COMMUNITY)
Admission: RE | Admit: 2020-05-16 | Discharge: 2020-05-16 | Disposition: A | Discharge status: Home / Self Care | Payer: Medicare Other | Source: Ambulatory Visit | Attending: Cardiology | Admitting: Cardiology

## 2020-05-16 DIAGNOSIS — Z20822 Contact with and (suspected) exposure to covid-19: Secondary | ICD-10-CM | POA: Diagnosis not present

## 2020-05-16 DIAGNOSIS — Z01818 Encounter for other preprocedural examination: Secondary | ICD-10-CM | POA: Diagnosis not present

## 2020-05-16 LAB — SARS CORONAVIRUS 2 (TAT 6-24 HRS): SARS Coronavirus 2: NEGATIVE

## 2020-05-16 NOTE — Telephone Encounter (Signed)
Late Entry: Spoke to pt yesterday about this

## 2020-05-17 NOTE — Progress Notes (Signed)
Attempted to call patient regarding procedure instructions for tomorrow.  Left voice mail on the following items: Arrival time 0530 Nothing to eat or drink after midnight No meds AM of procedure Responsible person to drive you home and stay with you for 24 hrs  Have you missed any doses of anti-coagulant Xarelto,- take tonight's dose

## 2020-05-18 ENCOUNTER — Ambulatory Visit (HOSPITAL_COMMUNITY): Payer: Medicare Other | Admitting: Anesthesiology

## 2020-05-18 ENCOUNTER — Encounter (HOSPITAL_COMMUNITY): Payer: Self-pay | Admitting: Cardiology

## 2020-05-18 ENCOUNTER — Ambulatory Visit (HOSPITAL_COMMUNITY)
Admission: RE | Admit: 2020-05-18 | Discharge: 2020-05-18 | Disposition: A | Discharge status: Home / Self Care | Payer: Medicare Other | Attending: Cardiology | Admitting: Cardiology

## 2020-05-18 ENCOUNTER — Other Ambulatory Visit: Payer: Self-pay

## 2020-05-18 ENCOUNTER — Encounter (HOSPITAL_COMMUNITY)
Admission: RE | Disposition: A | Discharge status: Home / Self Care | Payer: Self-pay | Source: Home / Self Care | Attending: Cardiology

## 2020-05-18 DIAGNOSIS — I4891 Unspecified atrial fibrillation: Secondary | ICD-10-CM | POA: Diagnosis not present

## 2020-05-18 DIAGNOSIS — Z880 Allergy status to penicillin: Secondary | ICD-10-CM | POA: Diagnosis not present

## 2020-05-18 DIAGNOSIS — M2010 Hallux valgus (acquired), unspecified foot: Secondary | ICD-10-CM | POA: Diagnosis not present

## 2020-05-18 DIAGNOSIS — Z8249 Family history of ischemic heart disease and other diseases of the circulatory system: Secondary | ICD-10-CM | POA: Diagnosis not present

## 2020-05-18 DIAGNOSIS — Z87891 Personal history of nicotine dependence: Secondary | ICD-10-CM | POA: Insufficient documentation

## 2020-05-18 DIAGNOSIS — I480 Paroxysmal atrial fibrillation: Secondary | ICD-10-CM | POA: Diagnosis not present

## 2020-05-18 DIAGNOSIS — R002 Palpitations: Secondary | ICD-10-CM | POA: Diagnosis not present

## 2020-05-18 DIAGNOSIS — Z7901 Long term (current) use of anticoagulants: Secondary | ICD-10-CM | POA: Insufficient documentation

## 2020-05-18 DIAGNOSIS — Z79899 Other long term (current) drug therapy: Secondary | ICD-10-CM | POA: Insufficient documentation

## 2020-05-18 DIAGNOSIS — E785 Hyperlipidemia, unspecified: Secondary | ICD-10-CM | POA: Diagnosis not present

## 2020-05-18 HISTORY — PX: ATRIAL FIBRILLATION ABLATION: EP1191

## 2020-05-18 LAB — GLUCOSE, CAPILLARY: Glucose-Capillary: 104 mg/dL — ABNORMAL HIGH (ref 70–99)

## 2020-05-18 LAB — POCT ACTIVATED CLOTTING TIME
Activated Clotting Time: 356 seconds
Activated Clotting Time: 345 seconds

## 2020-05-18 SURGERY — ATRIAL FIBRILLATION ABLATION
Anesthesia: General

## 2020-05-18 MED ORDER — LACTATED RINGERS IV SOLN: INTRAVENOUS | Status: DC | PRN

## 2020-05-18 MED ORDER — HEPARIN SODIUM (PORCINE) 1000 UNIT/ML IJ SOLN
INTRAMUSCULAR | Status: DC | PRN
  Administered 2020-05-18: 1000 [IU] via INTRAVENOUS
  Administered 2020-05-18: 15000 [IU] via INTRAVENOUS

## 2020-05-18 MED ORDER — SODIUM CHLORIDE 0.9 % IV SOLN: 250.0000 mL | INTRAVENOUS | Status: DC | PRN

## 2020-05-18 MED ORDER — PROPOFOL 10 MG/ML IV BOLUS
INTRAVENOUS | Status: DC | PRN
  Administered 2020-05-18: 30 mg via INTRAVENOUS
  Administered 2020-05-18: 120 mg via INTRAVENOUS

## 2020-05-18 MED ORDER — LIDOCAINE 2% (20 MG/ML) 5 ML SYRINGE
INTRAMUSCULAR | Status: DC | PRN
  Administered 2020-05-18: 40 mg via INTRAVENOUS
  Administered 2020-05-18: 60 mg via INTRAVENOUS

## 2020-05-18 MED ORDER — SODIUM CHLORIDE 0.9 % IV SOLN: INTRAVENOUS | Status: DC

## 2020-05-18 MED ORDER — MIDAZOLAM HCL 5 MG/5ML IJ SOLN
INTRAMUSCULAR | Status: DC | PRN
  Administered 2020-05-18: 2 mg via INTRAVENOUS

## 2020-05-18 MED ORDER — SODIUM CHLORIDE 0.9% FLUSH: 3.0000 mL | Freq: Two times a day (BID) | INTRAVENOUS | Status: DC

## 2020-05-18 MED ORDER — SODIUM CHLORIDE 0.9% FLUSH: 3.0000 mL | INTRAVENOUS | Status: DC | PRN

## 2020-05-18 MED ORDER — ONDANSETRON HCL 4 MG/2ML IJ SOLN: 4.0000 mg | Freq: Four times a day (QID) | INTRAMUSCULAR | Status: DC | PRN

## 2020-05-18 MED ORDER — ONDANSETRON HCL 4 MG/2ML IJ SOLN
INTRAMUSCULAR | Status: DC | PRN
  Administered 2020-05-18: 4 mg via INTRAVENOUS

## 2020-05-18 MED ORDER — PROTAMINE SULFATE 10 MG/ML IV SOLN
INTRAVENOUS | Status: DC | PRN
  Administered 2020-05-18: 40 mg via INTRAVENOUS

## 2020-05-18 MED ORDER — HEPARIN (PORCINE) IN NACL 1000-0.9 UT/500ML-% IV SOLN
INTRAVENOUS | Status: AC
  Filled 2020-05-18: qty 1000

## 2020-05-18 MED ORDER — FENTANYL CITRATE (PF) 250 MCG/5ML IJ SOLN
INTRAMUSCULAR | Status: DC | PRN
  Administered 2020-05-18: 100 ug via INTRAVENOUS

## 2020-05-18 MED ORDER — HEPARIN SODIUM (PORCINE) 1000 UNIT/ML IJ SOLN
INTRAMUSCULAR | Status: DC | PRN
  Administered 2020-05-18: 1000 [IU] via INTRAVENOUS

## 2020-05-18 MED ORDER — PHENYLEPHRINE HCL-NACL 10-0.9 MG/250ML-% IV SOLN
INTRAVENOUS | Status: DC | PRN
  Administered 2020-05-18: 20 ug/min via INTRAVENOUS

## 2020-05-18 MED ORDER — DOBUTAMINE IN D5W 4-5 MG/ML-% IV SOLN
INTRAVENOUS | Status: AC
  Filled 2020-05-18: qty 250

## 2020-05-18 MED ORDER — HEPARIN SODIUM (PORCINE) 1000 UNIT/ML IJ SOLN
INTRAMUSCULAR | Status: AC
  Filled 2020-05-18: qty 1

## 2020-05-18 MED ORDER — ROCURONIUM BROMIDE 10 MG/ML (PF) SYRINGE
PREFILLED_SYRINGE | INTRAVENOUS | Status: DC | PRN
  Administered 2020-05-18: 60 mg via INTRAVENOUS
  Administered 2020-05-18: 40 mg via INTRAVENOUS

## 2020-05-18 MED ORDER — ACETAMINOPHEN 325 MG PO TABS: 650.0000 mg | ORAL_TABLET | ORAL | Status: DC | PRN

## 2020-05-18 MED ORDER — DEXAMETHASONE SODIUM PHOSPHATE 10 MG/ML IJ SOLN
INTRAMUSCULAR | Status: DC | PRN
  Administered 2020-05-18: 4 mg via INTRAVENOUS

## 2020-05-18 MED ORDER — DOBUTAMINE IN D5W 4-5 MG/ML-% IV SOLN
INTRAVENOUS | Status: DC | PRN
  Administered 2020-05-18: 20 ug/kg/min via INTRAVENOUS

## 2020-05-18 MED ORDER — SUGAMMADEX SODIUM 200 MG/2ML IV SOLN
INTRAVENOUS | Status: DC | PRN
  Administered 2020-05-18: 300 mg via INTRAVENOUS

## 2020-05-18 MED ORDER — HEPARIN (PORCINE) IN NACL 1000-0.9 UT/500ML-% IV SOLN
INTRAVENOUS | Status: DC | PRN
  Administered 2020-05-18 (×5): 500 mL

## 2020-05-18 SURGICAL SUPPLY — 20 items
BLANKET WARM UNDERBOD FULL ACC (MISCELLANEOUS) ×2 IMPLANT
CATH MAPPNG PENTARAY F 2-6-2MM (CATHETERS) ×1 IMPLANT
CATH S CIRCA THERM PROBE 10F (CATHETERS) ×2 IMPLANT
CATH SMTCH THERMOCOOL SF DF (CATHETERS) ×2 IMPLANT
CATH SOUNDSTAR ECO 8FR (CATHETERS) ×2 IMPLANT
CATH WEBSTER BI DIR CS D-F CRV (CATHETERS) ×2 IMPLANT
CLOSURE PERCLOSE PROSTYLE (VASCULAR PRODUCTS) ×8 IMPLANT
COVER SWIFTLINK CONNECTOR (BAG) ×2 IMPLANT
KIT CATH VERSACROSS STEERABLE (CATHETERS) ×2 IMPLANT
PACK EP LATEX FREE (CUSTOM PROCEDURE TRAY) ×1
PACK EP LF (CUSTOM PROCEDURE TRAY) ×1 IMPLANT
PAD PRO RADIOLUCENT 2001M-C (PAD) ×2 IMPLANT
PATCH CARTO3 (PAD) ×2 IMPLANT
PENTARAY F 2-6-2MM (CATHETERS) ×2
SHEATH CARTO VIZIGO SM CVD (SHEATH) ×2 IMPLANT
SHEATH PINNACLE 7F 10CM (SHEATH) ×2 IMPLANT
SHEATH PINNACLE 8F 10CM (SHEATH) ×4 IMPLANT
SHEATH PINNACLE 9F 10CM (SHEATH) ×2 IMPLANT
SHEATH PROBE COVER 6X72 (BAG) ×2 IMPLANT
TUBING SMART ABLATE COOLFLOW (TUBING) ×2 IMPLANT

## 2020-05-18 NOTE — Transfer of Care (Signed)
Immediate Anesthesia Transfer of Care Note  Patient: Connie Norris  Procedure(s) Performed: ATRIAL FIBRILLATION ABLATION (N/A )  Patient Location: PACU  Anesthesia Type:General  Level of Consciousness: awake  Airway & Oxygen Therapy: Patient Spontanous Breathing  Post-op Assessment: Report given to RN and Post -op Vital signs reviewed and stable  Post vital signs: Reviewed and stable  Last Vitals:  Vitals Value Taken Time  BP 98/48 05/18/20 1009  Temp 36.5 C 05/18/20 1008  Pulse 64 05/18/20 1013  Resp 14 05/18/20 1013  SpO2 97 % 05/18/20 1013  Vitals shown include unvalidated device data.  Last Pain:  Vitals:   05/18/20 1008  TempSrc: Temporal  PainSc: Asleep      Patients Stated Pain Goal: 2 (52/59/10 2890)  Complications: No complications documented.

## 2020-05-18 NOTE — Interval H&P Note (Signed)
History and Physical Interval Note:  05/18/2020 7:06 AM  Connie Norris  has presented today for surgery, with the diagnosis of afib.  The various methods of treatment have been discussed with the patient and family. After consideration of risks, benefits and other options for treatment, the patient has consented to  Procedure(s): ATRIAL FIBRILLATION ABLATION (N/A) as a surgical intervention.  The patient's history has been reviewed, patient examined, no change in status, stable for surgery.  I have reviewed the patient's chart and labs.  Questions were answered to the patient's satisfaction.     Oakleigh Hesketh [employer]

## 2020-05-18 NOTE — Discharge Instructions (Signed)
Post procedure care instructions No driving for 4 days. No lifting over 5 lbs for 1 week. No vigorous or sexual activity for 1 week. You may return to work/your usual activities on 05/25/2020. Keep procedure site clean & dry. If you notice increased pain, swelling, bleeding or pus, call/return!  You may shower, but no soaking baths/hot tubs/pools for 1 week.      Cardiac Ablation, Care After  This sheet gives you information about how to care for yourself after your procedure. Your health care provider may also give you more specific instructions. If you have problems or questions, contact your health care provider. What can I expect after the procedure? After the procedure, it is common to have:  Bruising around your puncture site.  Tenderness around your puncture site.  Skipped heartbeats.  Tiredness (fatigue).  Follow these instructions at home: Puncture site care   Follow instructions from your health care provider about how to take care of your puncture site. Make sure you: ? If present, leave stitches (sutures), skin glue, or adhesive strips in place. These skin closures may need to stay in place for up to 2 weeks. If adhesive strip edges start to loosen and curl up, you may trim the loose edges. Do not remove adhesive strips completely unless your health care provider tells you to do that. ? If a square bandage is present, this may be removed in 24 hours.   Check your puncture site every day for signs of infection. Check for: ? Redness, swelling, or pain. ? Fluid or blood. If your puncture site starts to bleed, lie down on your back, apply firm pressure to the area, and contact your health care provider. ? Warmth. ? Pus or a bad smell. Driving  Do not drive for at least 4 days after your procedure or however long your health care provider recommends. (Do not resume driving if you have previously been instructed not to drive for other health reasons.)  Do not drive or use  heavy machinery while taking prescription pain medicine. Activity  Avoid activities that take a lot of effort for at least 7 days after your procedure.  Do not lift anything that is heavier than 5 lb (4.5 kg) for one week.   No sexual activity for 1 week.   Return to your normal activities as told by your health care provider. Ask your health care provider what activities are safe for you. General instructions  Take over-the-counter and prescription medicines only as told by your health care provider.  Do not use any products that contain nicotine or tobacco, such as cigarettes and e-cigarettes. If you need help quitting, ask your health care provider.  You may shower after 24 hours, but Do not take baths, swim, or use a hot tub for 1 week.   Do not drink alcohol for 24 hours after your procedure.  Keep all follow-up visits as told by your health care provider. This is important. Contact a health care provider if:  You have redness, mild swelling, or pain around your puncture site.  You have fluid or blood coming from your puncture site that stops after applying firm pressure to the area.  Your puncture site feels warm to the touch.  You have pus or a bad smell coming from your puncture site.  You have a fever.  You have chest pain or discomfort that spreads to your neck, jaw, or arm.  You are sweating a lot.  You feel nauseous.  You  have a fast or irregular heartbeat.  You have shortness of breath.  You are dizzy or light-headed and feel the need to lie down.  You have pain or numbness in the arm or leg closest to your puncture site. Get help right away if:  Your puncture site suddenly swells.  Your puncture site is bleeding and the bleeding does not stop after applying firm pressure to the area. These symptoms may represent a serious problem that is an emergency. Do not wait to see if the symptoms will go away. Get medical help right away. Call your local  emergency services (911 in the [place] Do not drive yourself to the hospital. Summary  After the procedure, it is normal to have bruising and tenderness at the puncture site in your groin, neck, or forearm.  Check your puncture site every day for signs of infection.  Get help right away if your puncture site is bleeding and the bleeding does not stop after applying firm pressure to the area. This is a medical emergency. This information is not intended to replace advice given to you by your health care provider. Make sure you discuss any questions you have with your health care provider.   You have an appointment set up with the [facility].  Multiple studies have shown that being followed by a dedicated atrial fibrillation clinic in addition to the standard care you receive from your other physicians improves health. We believe that enrollment in the atrial fibrillation clinic will allow [place] to better care for you.   The phone number to the [facility] is [phone]. The clinic is staffed Monday through Friday from 8:30am to 5pm.  Parking Directions: The clinic is located in the Heart and Vascular Building connected to [facility]. 1)From [address] turn on to [address] and go to the 3rd entrance  (Heart and Vascular entrance) on the right. 2)Look to the right for Heart &Vascular Parking Garage. 3)A code for the entrance is required, for December is 3007.   4)Take the elevators to the 1st floor. Registration is in the room with the glass walls at the end of the hallway.  If you have any trouble parking or locating the clinic, please don't hesitate to call [phone].

## 2020-05-18 NOTE — Anesthesia Preprocedure Evaluation (Signed)
Anesthesia Evaluation  Patient identified by MRN, date of birth, ID band Patient awake    Reviewed: Allergy & Precautions, H&P , NPO status , Patient's Chart, lab work & pertinent test results  History of Anesthesia Complications (+) PONV and history of anesthetic complications  Airway Mallampati: II   Neck ROM: full    Dental   Pulmonary former smoker,    breath sounds clear to auscultation       Cardiovascular + dysrhythmias Atrial Fibrillation  Rhythm:irregular Rate:Normal     Neuro/Psych    GI/Hepatic   Endo/Other    Renal/GU      Musculoskeletal   Abdominal   Peds  Hematology   Anesthesia Other Findings   Reproductive/Obstetrics                             Anesthesia Physical Anesthesia Plan  ASA: II  Anesthesia Plan: General   Post-op Pain Management:    Induction: Intravenous  PONV Risk Score and Plan: 4 or greater and Ondansetron, Dexamethasone, Midazolam and Treatment may vary due to age or medical condition  Airway Management Planned: Oral ETT  Additional Equipment:   Intra-op Plan:   Post-operative Plan: Extubation in OR  Informed Consent: I have reviewed the patients History and Physical, chart, labs and discussed the procedure including the risks, benefits and alternatives for the proposed anesthesia with the patient or authorized representative who has indicated his/her understanding and acceptance.       Plan Discussed with: CRNA, Anesthesiologist and Surgeon  Anesthesia Plan Comments:         Anesthesia Quick Evaluation

## 2020-05-18 NOTE — Anesthesia Procedure Notes (Signed)
Procedure Name: Intubation Performed by: Milford Cage, CRNA Pre-anesthesia Checklist: Patient identified, Emergency Drugs available, Suction available and Patient being monitored Patient Re-evaluated:Patient Re-evaluated prior to induction Oxygen Delivery Method: Circle System Utilized Preoxygenation: Pre-oxygenation with 100% oxygen Induction Type: IV induction Ventilation: Mask ventilation without difficulty Laryngoscope Size: Miller and 2 Grade View: Grade II Tube type: Oral Number of attempts: 1 Airway Equipment and Method: Stylet and Oral airway Placement Confirmation: ETT inserted through vocal cords under direct vision,  positive ETCO2 and breath sounds checked- equal and bilateral Secured at: 23 cm Tube secured with: Tape Dental Injury: Teeth and Oropharynx as per pre-operative assessment

## 2020-05-22 NOTE — Anesthesia Postprocedure Evaluation (Signed)
Anesthesia Post Note  Patient: Connie Norris  Procedure(s) Performed: ATRIAL FIBRILLATION ABLATION (N/A )     Patient location during evaluation: PACU Anesthesia Type: General Level of consciousness: awake and alert Pain management: pain level controlled Vital Signs Assessment: post-procedure vital signs reviewed and stable Respiratory status: spontaneous breathing, nonlabored ventilation, respiratory function stable and patient connected to nasal cannula oxygen Cardiovascular status: blood pressure returned to baseline and stable Postop Assessment: no apparent nausea or vomiting Anesthetic complications: no   No complications documented.  Last Vitals:  Vitals:   05/18/20 1245 05/18/20 1315  BP: (!) 125/56 (!) 144/69  Pulse: (!) 58 62  Resp: 15 18  Temp:    SpO2: 97% 98%    Last Pain:  Vitals:   05/18/20 1055  TempSrc:   PainSc: 0-No pain   Pain Goal: Patients Stated Pain Goal: 2 (05/18/20 0109)                 Grainne Knights S

## 2020-06-11 ENCOUNTER — Encounter (HOSPITAL_COMMUNITY): Payer: Self-pay | Admitting: Nurse Practitioner

## 2020-06-11 ENCOUNTER — Ambulatory Visit (HOSPITAL_COMMUNITY)
Admission: RE | Admit: 2020-06-11 | Discharge: 2020-06-11 | Disposition: A | Discharge status: Home / Self Care | Payer: Medicare Other | Source: Ambulatory Visit | Attending: Nurse Practitioner | Admitting: Nurse Practitioner

## 2020-06-11 ENCOUNTER — Other Ambulatory Visit: Payer: Self-pay

## 2020-06-11 VITALS — BP 152/94 | HR 62 | Ht 64.5 in | Wt 176.6 lb

## 2020-06-11 DIAGNOSIS — D6869 Other thrombophilia: Secondary | ICD-10-CM | POA: Diagnosis not present

## 2020-06-11 DIAGNOSIS — Z79899 Other long term (current) drug therapy: Secondary | ICD-10-CM | POA: Insufficient documentation

## 2020-06-11 DIAGNOSIS — I480 Paroxysmal atrial fibrillation: Secondary | ICD-10-CM | POA: Diagnosis not present

## 2020-06-11 DIAGNOSIS — Z7901 Long term (current) use of anticoagulants: Secondary | ICD-10-CM | POA: Insufficient documentation

## 2020-06-11 DIAGNOSIS — Z87891 Personal history of nicotine dependence: Secondary | ICD-10-CM | POA: Insufficient documentation

## 2020-06-11 NOTE — Progress Notes (Signed)
Primary Care Physician: [name], MD Referring Physician: Dr. Curt Bears Cardiologist: Dr. Morrell Riddle Connie Norris is a 64 y.o. female with a h/o PAF that is in the afib clinic f/u afib ablation, 05/18/20. She reports that she has not noted any further afib she continues on flecainide 50 mg bid. No swallowing or groin issues. Continues on xarelto 20 mg daily for a CHA2DS2VASc score of 2.  Today, she denies symptoms of palpitations, chest pain, shortness of breath, orthopnea, PND, lower extremity edema, dizziness, presyncope, syncope, or neurologic sequela. The patient is tolerating medications without difficulties and is otherwise without complaint today.   Past Medical History:  Diagnosis Date  . Atrial fibrillation ([place])   . Hyperlipidemia 12/14/2018  . PONV (postoperative nausea and vomiting)    Past Surgical History:  Procedure Laterality Date  . ABDOMINAL HYSTERECTOMY    . [name] OSTEOTOMY Right 06/04/2017   Procedure: [employer];  Surgeon: [name], MD;  Location: [place];  Service: Orthopedics;  Laterality: Right;  . ATRIAL FIBRILLATION ABLATION N/A 05/18/2020   Procedure: ATRIAL FIBRILLATION ABLATION;  Surgeon: [name], MD;  Location: [facility];  Service: Cardiovascular;  Laterality: N/A;  . CESAREAN SECTION    . METATARSAL OSTEOTOMY WITH BUNIONECTOMY Right 06/04/2017   Procedure: Right 1st metatarsal scarf osteotomy, modified McBride bunionectomy and  Akin osteotomy;  Surgeon: [name], MD;  Location: [facility];  Service: Orthopedics;  Laterality: Right;  . WRIST FRACTURE SURGERY     1999 left wrist    Current Outpatient Medications  Medication Sig Dispense Refill  . acebutolol (SECTRAL) 200 MG capsule Take 1 capsule (200 mg total) by mouth 2 (two) times daily. 180 capsule 1  . B Complex Vitamins (PA B-COMPLEX WITH B-12 PO) Take 1 mL by mouth every other day. Liquid    . co-enzyme Q-10 30 MG capsule Take  30 mg by mouth every other day.     . flecainide (TAMBOCOR) 50 MG tablet TAKE 1 TABLET BY MOUTH TWICE DAILY (Patient taking differently: Take 50 mg by mouth 2 (two) times daily. ) 180 tablet 1  . TURMERIC PO Take 1 capsule by mouth daily.    [name] 20 MG TABS tablet TAKE 1 TABLET BY MOUTH EVERY DAY WITH SUPPER (Patient taking differently: Take 20 mg by mouth daily with supper. ) 90 tablet 1   No current facility-administered medications for this encounter.    Allergies  Allergen Reactions  . Other     Alpha-gal (resulted from tick bite, now allergic to meats)  . Penicillins Rash    Reaction: 40 Years    Social History   Socioeconomic History  . Marital status: Married    Spouse name: Not on file  . Number of children: Not on file  . Years of education: Not on file  . Highest education level: Not on file  Occupational History  . Not on file  Tobacco Use  . Smoking status: Former Smoker    Types: Cigarettes    Quit date: 2008    Years since quitting: 13.9  . Smokeless tobacco: Never Used  Vaping Use  . Vaping Use: Some days  . Start date: 02/24/2014  Substance and Sexual Activity  . Alcohol use: Yes    Alcohol/week: 2.0 standard drinks    Types: 2 Glasses of wine per week    Comment: red wine 2 glasses per week   . Drug use: No  . Sexual  activity: Not on file  Other Topics Concern  . Not on file  Social History Narrative  . Not on file   Social Determinants of Health   Financial Resource Strain:   . Difficulty of Paying Living Expenses: Not on file  Food Insecurity:   . Worried About Charity fundraiser in the Last Year: Not on file  . Ran Out of Food in the Last Year: Not on file  Transportation Needs:   . Lack of Transportation (Medical): Not on file  . Lack of Transportation (Non-Medical): Not on file  Physical Activity:   . Days of Exercise per Week: Not on file  . Minutes of Exercise per Session: Not on file  Stress:   . Feeling of Stress : Not on  file  Social Connections:   . Frequency of Communication with Friends and Family: Not on file  . Frequency of Social Gatherings with Friends and Family: Not on file  . Attends Religious Services: Not on file  . Active Member of Clubs or Organizations: Not on file  . Attends Archivist Meetings: Not on file  . Marital Status: Not on file  Intimate Partner Violence:   . Fear of Current or Ex-Partner: Not on file  . Emotionally Abused: Not on file  . Physically Abused: Not on file  . Sexually Abused: Not on file    Family History  Problem Relation Age of Onset  . Cancer Mother   . Heart attack Mother   . Heart attack Father   . COPD Father   . Heart disease Maternal Grandmother        Pacemaker  . Parkinson's disease Sister   . COPD Brother     ROS- All systems are reviewed and negative except as per the HPI above  Physical Exam: Vitals:   06/11/20 1321  BP: (!) 152/94  Pulse: 62  Weight: 80.1 kg  Height: 5' 4.5" (1.638 m)   Wt Readings from Last 3 Encounters:  06/11/20 80.1 kg  05/18/20 77.6 kg  04/30/20 78.5 kg    Labs: Lab Results  Component Value Date   NA 140 04/30/2020   K 4.4 04/30/2020   CL 106 04/30/2020   CO2 22 04/30/2020   GLUCOSE 83 04/30/2020   BUN 21 04/30/2020   CREATININE 0.92 04/30/2020   CALCIUM 9.5 04/30/2020   No results found for: INR Lab Results  Component Value Date   CHOL 267 (H) 02/02/2020   HDL 86 02/02/2020   LDLCALC 162 (H) 02/02/2020   TRIG 110 02/02/2020     GEN- The patient is well appearing, alert and oriented x 3 today.   Head- normocephalic, atraumatic Eyes-  Sclera clear, conjunctiva pink Ears- hearing intact Oropharynx- clear Neck- supple, no JVP Lymph- no cervical lymphadenopathy Lungs- Clear to ausculation bilaterally, normal work of breathing Heart- Regular rate and rhythm, no murmurs, rubs or gallops, PMI not laterally displaced GI- soft, NT, ND, + BS Extremities- no clubbing, cyanosis, or  edema MS- no significant deformity or atrophy Skin- no rash or lesion Psych- euthymic mood, full affect Neuro- strength and sensation are intact  EKG-NSR at 62 bpm, pr int 162 ms, qrs int 92,qtc 424 ms     Assessment and Plan: 1, Afib S/p ablation one month post procedure  Doing well staying in SR Continue flecainide 50 mg bid  Continue acebutolol 200 mg bid   2. CHA2DS2VASc score of 2 Continue xarelto 20 mg daily   Reminded no  to miss any anticoagulation   F/u with Dr. Curt Bears 09/03/20  Geroge Baseman. Finnigan Warriner, [facility] [address] [place], [place] [address] [phone]

## 2020-06-15 ENCOUNTER — Ambulatory Visit (HOSPITAL_COMMUNITY): Payer: Medicare Other | Admitting: Nurse Practitioner

## 2020-06-19 DIAGNOSIS — Z23 Encounter for immunization: Secondary | ICD-10-CM | POA: Diagnosis not present

## 2020-06-25 DIAGNOSIS — K4090 Unilateral inguinal hernia, without obstruction or gangrene, not specified as recurrent: Secondary | ICD-10-CM | POA: Diagnosis not present

## 2020-06-25 DIAGNOSIS — J9811 Atelectasis: Secondary | ICD-10-CM | POA: Diagnosis not present

## 2020-06-25 DIAGNOSIS — I771 Stricture of artery: Secondary | ICD-10-CM | POA: Diagnosis not present

## 2020-06-25 DIAGNOSIS — D3500 Benign neoplasm of unspecified adrenal gland: Secondary | ICD-10-CM | POA: Diagnosis not present

## 2020-06-25 DIAGNOSIS — I774 Celiac artery compression syndrome: Secondary | ICD-10-CM | POA: Diagnosis not present

## 2020-06-25 DIAGNOSIS — K429 Umbilical hernia without obstruction or gangrene: Secondary | ICD-10-CM | POA: Diagnosis not present

## 2020-06-25 DIAGNOSIS — D3502 Benign neoplasm of left adrenal gland: Secondary | ICD-10-CM | POA: Diagnosis not present

## 2020-06-25 DIAGNOSIS — M545 Low back pain, unspecified: Secondary | ICD-10-CM | POA: Diagnosis not present

## 2020-06-25 DIAGNOSIS — R1032 Left lower quadrant pain: Secondary | ICD-10-CM | POA: Diagnosis not present

## 2020-06-25 DIAGNOSIS — M25551 Pain in right hip: Secondary | ICD-10-CM | POA: Diagnosis not present

## 2020-07-02 DIAGNOSIS — R509 Fever, unspecified: Secondary | ICD-10-CM | POA: Diagnosis not present

## 2020-07-02 DIAGNOSIS — Z20822 Contact with and (suspected) exposure to covid-19: Secondary | ICD-10-CM | POA: Diagnosis not present

## 2020-07-02 DIAGNOSIS — R059 Cough, unspecified: Secondary | ICD-10-CM | POA: Diagnosis not present

## 2020-07-29 ENCOUNTER — Other Ambulatory Visit: Payer: Self-pay | Admitting: Cardiology

## 2020-07-29 DIAGNOSIS — I480 Paroxysmal atrial fibrillation: Secondary | ICD-10-CM

## 2020-07-30 ENCOUNTER — Other Ambulatory Visit: Payer: Self-pay

## 2020-07-30 DIAGNOSIS — I480 Paroxysmal atrial fibrillation: Secondary | ICD-10-CM

## 2020-07-30 MED ORDER — RIVAROXABAN 20 MG PO TABS: ORAL_TABLET | ORAL | 1 refills | Status: DC

## 2020-09-03 ENCOUNTER — Ambulatory Visit (INDEPENDENT_AMBULATORY_CARE_PROVIDER_SITE_OTHER): Payer: Medicare Other | Admitting: Cardiology

## 2020-09-03 ENCOUNTER — Encounter: Payer: Self-pay | Admitting: Cardiology

## 2020-09-03 ENCOUNTER — Other Ambulatory Visit: Payer: Self-pay

## 2020-09-03 VITALS — BP 116/74 | HR 67 | Ht 64.5 in | Wt 174.0 lb

## 2020-09-03 DIAGNOSIS — I480 Paroxysmal atrial fibrillation: Secondary | ICD-10-CM | POA: Diagnosis not present

## 2020-09-03 NOTE — Patient Instructions (Addendum)
Medication Instructions:  Your physician has recommended you make the following change in your medication: 1. STOP Flecainide 2. STOP Acebutolol  *If you need a refill on your cardiac medications before your next appointment, please call your pharmacy*   Lab Work: None ordered   Testing/Procedures: None ordered   Follow-Up: At [facility], you and your health needs are our priority.  As part of our continuing mission to provide you with exceptional heart care, we have created designated Provider Care Teams.  These Care Teams include your primary Cardiologist (physician) and Advanced Practice Providers (APPs -  Physician Assistants and Nurse Practitioners) who all work together to provide you with the care you need, when you need it.  Your next appointment:   3 month(s)  The format for your next appointment:   In Person  Provider:   Allegra Lai, MD    Thank you for choosing [facility]!!   Trinidad Curet, RN [phone]   Other Instructions

## 2020-09-03 NOTE — Progress Notes (Signed)
Electrophysiology Office Note   Date:  09/03/2020   ID:  Connie, Norris [birth year], MRN [id number]  PCP:  [name], MD  Cardiologist:  Bettina Gavia Primary Electrophysiologist:  Chane Cowden [name], MD    Chief Complaint: AF   History of Present Illness: Connie Norris is a 65 y.o. female who is being seen today for the evaluation of AF at the request of [name], MD. Presenting today for electrophysiology evaluation.  She has a history significant for atrial fibrillation and hyperlipidemia.  She is currently on Eliquis and flecainide.  She would like to come off of her antiarrhythmics.  She has had continued atrial fibrillation despite flecainide use.  She is status post AF ablation 05/18/2020.  Today, denies symptoms of palpitations, chest pain, shortness of breath, orthopnea, PND, lower extremity edema, claudication, dizziness, presyncope, syncope, bleeding, or neurologic sequela. The patient is tolerating medications without difficulties.  Since being seen she has done well.  She has noted no further episodes of atrial fibrillation.  She is anxious to get off of her rhythm controlling medications.   Past Medical History:  Diagnosis Date  . Atrial fibrillation ([place])   . Hyperlipidemia 12/14/2018  . PONV (postoperative nausea and vomiting)    Past Surgical History:  Procedure Laterality Date  . ABDOMINAL HYSTERECTOMY    . [name] OSTEOTOMY Right 06/04/2017   Procedure: [employer];  Surgeon: [name], MD;  Location: [place];  Service: Orthopedics;  Laterality: Right;  . ATRIAL FIBRILLATION ABLATION N/A 05/18/2020   Procedure: ATRIAL FIBRILLATION ABLATION;  Surgeon: [name], MD;  Location: [place] [facility];  Service: Cardiovascular;  Laterality: N/A;  . CESAREAN SECTION    . METATARSAL OSTEOTOMY WITH BUNIONECTOMY Right 06/04/2017   Procedure: Right 1st metatarsal scarf osteotomy, modified McBride bunionectomy and  Akin osteotomy;   Surgeon: [name], MD;  Location: [place];  Service: Orthopedics;  Laterality: Right;  . WRIST FRACTURE SURGERY     1999 left wrist     Current Outpatient Medications  Medication Sig Dispense Refill  . B Complex Vitamins (PA B-COMPLEX WITH B-12 PO) Take 1 mL by mouth every other day. Liquid    . co-enzyme Q-10 30 MG capsule Take 30 mg by mouth every other day.     . rivaroxaban (XARELTO) 20 MG TABS tablet TAKE 1 TABLET BY MOUTH EVERY DAY WITH SUPPER 90 tablet 1  . TURMERIC PO Take 1 capsule by mouth daily.     No current facility-administered medications for this visit.    Allergies:   Other and Penicillins   Social History:  The patient  reports that she quit smoking about 14 years ago. Her smoking use included cigarettes. She has never used smokeless tobacco. She reports current alcohol use of about 2.0 standard drinks of alcohol per week. She reports that she does not use drugs.   Family History:  The patient's family history includes COPD in her brother and father; Cancer in her mother; Heart attack in her father and mother; Heart disease in her maternal grandmother; Parkinson's disease in her sister.   ROS:  Please see the history of present illness.   Otherwise, review of systems is positive for none.   All other systems are reviewed and negative.   PHYSICAL EXAM: VS:  BP 116/74   Pulse 67   Ht 5' 4.5" (1.638 m)   Wt 174 lb (78.9 kg)   SpO2 99%   BMI 29.41 kg/m  ,  BMI Body mass index is 29.41 kg/m. GEN: Well nourished, well developed, in no acute distress  HEENT: normal  Neck: no JVD, carotid bruits, or masses Cardiac: RRR; no murmurs, rubs, or gallops,no edema  Respiratory:  clear to auscultation bilaterally, normal work of breathing GI: soft, nontender, nondistended, + BS MS: no deformity or atrophy  Skin: warm and dry Neuro:  Strength and sensation are intact Psych: euthymic mood, full affect  EKG:  EKG is ordered today. Personal review of  the ekg ordered shows sinus rhythm, rate 67  Recent Labs: 02/02/2020: NT-Pro BNP 188 04/30/2020: BUN 21; Creatinine, Ser 0.92; Hemoglobin 13.7; Platelets 255; Potassium 4.4; Sodium 140    Lipid Panel     Component Value Date/Time   CHOL 267 (H) 02/02/2020 0921   TRIG 110 02/02/2020 0921   HDL 86 02/02/2020 0921   CHOLHDL 3.1 02/02/2020 0921   LDLCALC 162 (H) 02/02/2020 0921     Wt Readings from Last 3 Encounters:  09/03/20 174 lb (78.9 kg)  06/11/20 176 lb 9.6 oz (80.1 kg)  05/18/20 171 lb (77.6 kg)      Other studies Reviewed: Additional studies/ records that were reviewed today include: TTE 04/20/19  Review of the above records today demonstrates:  1. Left ventricular ejection fraction, by visual estimation, is 60 to  65%. The left ventricle has normal function. Normal left ventricular size.  There is no left ventricular hypertrophy.  2. Global right ventricle has normal systolic function.The right  ventricular size is normal. No increase in right ventricular wall  thickness.  3. Left atrial size was normal.  4. Right atrial size was normal.  5. The mitral valve is normal in structure. Mild mitral valve  regurgitation. No evidence of mitral stenosis.  6. The tricuspid valve is normal in structure. Tricuspid valve  regurgitation is trivial.  7. The aortic valve is normal in structure. Aortic valve regurgitation  was not visualized by color flow Doppler. Structurally normal aortic  valve, with no evidence of sclerosis or stenosis.  8. The pulmonic valve was normal in structure. Pulmonic valve  regurgitation is mild by color flow Doppler.  9. Normal pulmonary artery systolic pressure.  10. The inferior vena cava is normal in size with greater than 50%  respiratory variability, suggesting right atrial pressure of 3 mmHg.    ASSESSMENT AND PLAN:  1.  Paroxysmal atrial fibrillation: Currently on flecainide, acebutolol, Xarelto.  CHA2DS2-VASc of 2.  High risk  medication monitoring.  Is status post AF ablation 05/18/2020.  She has not had any further episodes of atrial fibrillation.  Due to that, we Sharryn Belding stop both flecainide and acebutolol.  She does state that she plays tennis and is unable to get her heart rate up to an acceptable level.  Current medicines are reviewed at length with the patient today.   The patient does not have concerns regarding her medicines.  The following changes were made today: Stop flecainide, acebutolol  Labs/ tests ordered today include:  Orders Placed This Encounter  Procedures  . EKG 12-Lead     Disposition:   FU with Jamesetta Greenhalgh 3 months  Signed, Adir Schicker [name], MD  09/03/2020 11:14 AM     [facility] [address] [place] [address] [phone] (office) [phone] (fax)

## 2020-10-17 DIAGNOSIS — Z79899 Other long term (current) drug therapy: Secondary | ICD-10-CM | POA: Diagnosis not present

## 2020-10-17 DIAGNOSIS — Z90710 Acquired absence of both cervix and uterus: Secondary | ICD-10-CM | POA: Diagnosis not present

## 2020-10-17 DIAGNOSIS — Z0000 Encounter for general adult medical examination without abnormal findings: Secondary | ICD-10-CM | POA: Diagnosis not present

## 2020-12-24 ENCOUNTER — Encounter: Payer: Self-pay | Admitting: Cardiology

## 2020-12-24 ENCOUNTER — Other Ambulatory Visit: Payer: Self-pay

## 2020-12-24 ENCOUNTER — Ambulatory Visit (INDEPENDENT_AMBULATORY_CARE_PROVIDER_SITE_OTHER): Payer: Medicare Other | Admitting: Cardiology

## 2020-12-24 VITALS — BP 138/62 | HR 67 | Ht 64.5 in | Wt 169.0 lb

## 2020-12-24 DIAGNOSIS — I480 Paroxysmal atrial fibrillation: Secondary | ICD-10-CM | POA: Diagnosis not present

## 2020-12-24 NOTE — Patient Instructions (Signed)
Medication Instructions:  Your physician has recommended you make the following change in your medication STOP Xarelto  *If you need a refill on your cardiac medications before your next appointment, please call your pharmacy*   Lab Work: None ordered  Testing/Procedures: None ordered   Follow-Up: At [facility], you and your health needs are our priority.  As part of our continuing mission to provide you with exceptional heart care, we have created designated Provider Care Teams.  These Care Teams include your primary Cardiologist (physician) and Advanced Practice Providers (APPs -  Physician Assistants and Nurse Practitioners) who all work together to provide you with the care you need, when you need it.  Your next appointment:   6 month(s)  The format for your next appointment:   In Person  Provider:   Allegra Lai, MD    Thank you for choosing [facility]!!   Trinidad Curet, RN [phone]

## 2020-12-24 NOTE — Progress Notes (Signed)
Electrophysiology Office Note   Date:  12/24/2020   ID:  Connie, Norris [birth year], MRN [id number]  PCP:  Connie Right, MD  Cardiologist:  Connie Norris Primary Electrophysiologist:  Connie Norris Connie Leeds, MD    Chief Complaint: AF   History of Present Illness: Connie Norris is a 65 y.o. female who is being seen today for the evaluation of AF at the request of Connie Right, MD. Presenting today for electrophysiology evaluation.  She has a history significant for atrial fibrillation and hyperlipidemia.  She was initially flecainide and acebutolol.  She has now status post atrial fibrillation ablation on 05/18/2020.  Her acebutolol and flecainide have since been stopped.  Today, denies symptoms of palpitations, chest pain, shortness of breath, orthopnea, PND, lower extremity edema, claudication, dizziness, presyncope, syncope, bleeding, or neurologic sequela. The patient is tolerating medications without difficulties.  Since last being seen she has done well.  She has noted no further episodes of atrial fibrillation.  She continues to be active playing tennis.  She is able to compete without issue.   Past Medical History:  Diagnosis Date   Atrial fibrillation ([place])    Hyperlipidemia 12/14/2018   PONV (postoperative nausea and vomiting)    Past Surgical History:  Procedure Laterality Date   ABDOMINAL HYSTERECTOMY     [name] OSTEOTOMY Norris 06/04/2017   Procedure: [employer];  Surgeon: [name], MD;  Location: [place];  Service: Orthopedics;  Laterality: Norris;   ATRIAL FIBRILLATION ABLATION N/A 05/18/2020   Procedure: ATRIAL FIBRILLATION ABLATION;  Surgeon: Connie Haw, MD;  Location: [place] [facility];  Service: Cardiovascular;  Laterality: N/A;   CESAREAN SECTION     METATARSAL OSTEOTOMY WITH BUNIONECTOMY Norris 06/04/2017   Procedure: Norris 1st metatarsal scarf osteotomy, modified McBride bunionectomy and  Akin osteotomy;  Surgeon: [name],  MD;  Location: [place];  Service: Orthopedics;  Laterality: Norris;   WRIST FRACTURE SURGERY     1999 left wrist     Current Outpatient Medications  Medication Sig Dispense Refill   B Complex Vitamins (PA B-COMPLEX WITH B-12 PO) Take 1 mL by mouth every other day. Liquid     co-enzyme Q-10 30 MG capsule Take 30 mg by mouth every other day.      TURMERIC PO Take 1 capsule by mouth daily.     No current facility-administered medications for this visit.    Allergies:   Other and Penicillins   Social History:  The patient  reports that she quit smoking about 14 years ago. Her smoking use included cigarettes. She has never used smokeless tobacco. She reports current alcohol use of about 2.0 standard drinks of alcohol per week. She reports that she does not use drugs.   Family History:  The patient's family history includes COPD in her brother and father; Cancer in her mother; Heart attack in her father and mother; Heart disease in her maternal grandmother; Parkinson's disease in her sister.   ROS:  Please see the history of present illness.   Otherwise, review of systems is positive for none.   All other systems are reviewed and negative.   PHYSICAL EXAM: VS:  BP 138/62   Pulse 67   Ht 5' 4.5" (1.638 m)   Wt 169 lb (76.7 kg)   BMI 28.56 kg/m  , BMI Body mass index is 28.56 kg/m. GEN: Well nourished, well developed, in no acute distress  HEENT: normal  Neck: no JVD, carotid bruits, or masses Cardiac:  RRR; no murmurs, rubs, or gallops,no edema  Respiratory:  clear to auscultation bilaterally, normal work of breathing GI: soft, nontender, nondistended, + BS MS: no deformity or atrophy  Skin: warm and dry Neuro:  Strength and sensation are intact Psych: euthymic mood, full affect  EKG:  EKG is ordered today. Personal review of the ekg ordered shows sinus rhythm, rate 67  Recent Labs: 02/02/2020: NT-Pro BNP 188 04/30/2020: BUN 21; Creatinine, Ser 0.92; Hemoglobin  13.7; Platelets 255; Potassium 4.4; Sodium 140    Lipid Panel     Component Value Date/Time   CHOL 267 (H) 02/02/2020 0921   TRIG 110 02/02/2020 0921   HDL 86 02/02/2020 0921   CHOLHDL 3.1 02/02/2020 0921   LDLCALC 162 (H) 02/02/2020 0921     Wt Readings from Last 3 Encounters:  12/24/20 169 lb (76.7 kg)  09/03/20 174 lb (78.9 kg)  06/11/20 176 lb 9.6 oz (80.1 kg)      Other studies Reviewed: Additional studies/ records that were reviewed today include: TTE 04/20/19  Review of the above records today demonstrates:   1. Left ventricular ejection fraction, by visual estimation, is 60 to  65%. The left ventricle has normal function. Normal left ventricular size.  There is no left ventricular hypertrophy.   2. Global Norris ventricle has normal systolic function.The Norris  ventricular size is normal. No increase in Norris ventricular wall  thickness.   3. Left atrial size was normal.   4. Norris atrial size was normal.   5. The mitral valve is normal in structure. Mild mitral valve  regurgitation. No evidence of mitral stenosis.   6. The tricuspid valve is normal in structure. Tricuspid valve  regurgitation is trivial.   7. The aortic valve is normal in structure. Aortic valve regurgitation  was not visualized by color flow Doppler. Structurally normal aortic  valve, with no evidence of sclerosis or stenosis.   8. The pulmonic valve was normal in structure. Pulmonic valve  regurgitation is mild by color flow Doppler.   9. Normal pulmonary artery systolic pressure.  10. The inferior vena cava is normal in size with greater than 50%  respiratory variability, suggesting Norris atrial pressure of 3 mmHg.    ASSESSMENT AND PLAN:  1.  Paroxysmal atrial fibrillation: Currently on Xarelto.  CHA2DS2-VASc of 2.  Is status post A. fib ablation 05/18/2020.  She is remained in sinus rhythm without further episodes.  She would like to be off of her medication.  We Jayveion Stalling stop  Xarelto.  Current medicines are reviewed at length with the patient today.   The patient does not have concerns regarding her medicines.  The following changes were made today: Stop Xarelto  Labs/ tests ordered today include:  Orders Placed This Encounter  Procedures   EKG 12-Lead      Disposition:   FU with Sallee Hogrefe 6 months  Signed, Kelani Robart Connie Leeds, MD  12/24/2020 10:04 AM     [address] [place] [address] [phone] (office) [phone] (fax)

## 2021-01-12 DIAGNOSIS — Z7952 Long term (current) use of systemic steroids: Secondary | ICD-10-CM | POA: Diagnosis not present

## 2021-01-12 DIAGNOSIS — L500 Allergic urticaria: Secondary | ICD-10-CM | POA: Diagnosis not present

## 2021-01-12 DIAGNOSIS — Z79899 Other long term (current) drug therapy: Secondary | ICD-10-CM | POA: Diagnosis not present

## 2021-01-12 DIAGNOSIS — R21 Rash and other nonspecific skin eruption: Secondary | ICD-10-CM | POA: Diagnosis not present

## 2021-02-14 ENCOUNTER — Other Ambulatory Visit: Payer: Self-pay | Admitting: Cardiology

## 2021-02-14 DIAGNOSIS — I480 Paroxysmal atrial fibrillation: Secondary | ICD-10-CM

## 2021-03-14 DIAGNOSIS — R928 Other abnormal and inconclusive findings on diagnostic imaging of breast: Secondary | ICD-10-CM | POA: Diagnosis not present

## 2021-03-14 DIAGNOSIS — R922 Inconclusive mammogram: Secondary | ICD-10-CM | POA: Diagnosis not present

## 2021-05-27 DIAGNOSIS — S63635A Sprain of interphalangeal joint of left ring finger, initial encounter: Secondary | ICD-10-CM | POA: Diagnosis not present

## 2021-06-14 DIAGNOSIS — Z23 Encounter for immunization: Secondary | ICD-10-CM | POA: Diagnosis not present

## 2021-06-17 ENCOUNTER — Ambulatory Visit: Payer: Medicare Other | Admitting: Cardiology

## 2021-07-02 DIAGNOSIS — L814 Other melanin hyperpigmentation: Secondary | ICD-10-CM | POA: Diagnosis not present

## 2021-07-02 DIAGNOSIS — D225 Melanocytic nevi of trunk: Secondary | ICD-10-CM | POA: Diagnosis not present

## 2021-07-02 DIAGNOSIS — D2239 Melanocytic nevi of other parts of face: Secondary | ICD-10-CM | POA: Diagnosis not present

## 2021-07-02 DIAGNOSIS — L821 Other seborrheic keratosis: Secondary | ICD-10-CM | POA: Diagnosis not present

## 2021-10-21 DIAGNOSIS — Z0000 Encounter for general adult medical examination without abnormal findings: Secondary | ICD-10-CM | POA: Diagnosis not present

## 2021-10-21 DIAGNOSIS — N3080 Other cystitis without hematuria: Secondary | ICD-10-CM | POA: Diagnosis not present

## 2021-10-21 DIAGNOSIS — Z6829 Body mass index (BMI) 29.0-29.9, adult: Secondary | ICD-10-CM | POA: Diagnosis not present

## 2021-10-21 DIAGNOSIS — N3000 Acute cystitis without hematuria: Secondary | ICD-10-CM | POA: Diagnosis not present

## 2021-10-21 DIAGNOSIS — E663 Overweight: Secondary | ICD-10-CM | POA: Diagnosis not present

## 2022-03-24 DIAGNOSIS — Z1231 Encounter for screening mammogram for malignant neoplasm of breast: Secondary | ICD-10-CM | POA: Diagnosis not present

## 2022-04-09 DIAGNOSIS — N3000 Acute cystitis without hematuria: Secondary | ICD-10-CM | POA: Diagnosis not present

## 2022-04-09 DIAGNOSIS — R300 Dysuria: Secondary | ICD-10-CM | POA: Diagnosis not present

## 2022-04-23 IMAGING — CT CT HEART MORPH/PULM VEIN W/ CM & W/O CA SCORE
2 of 7 series · 11 of 20 positions shown, 13 images · IV contrast (Omni 300)
Comparison: None.
COMPARISON: None.

Addendum:
EXAM:
OVER-READ INTERPRETATION  CT CHEST

The following report is an over-read performed by radiologist Dr.
Tat Tiger [[email or web address]] on 05/14/2020. This
over-read does not include interpretation of cardiac or coronary
anatomy or pathology. The coronary calcium score/coronary CTA
interpretation by the cardiologist is attached.
CLINICAL DATA: 64F with atrial fibrillation scheduled for an
ablation.
Cardiac CT/CTA
TECHNIQUE: The patient was scanned on a Siemens Somatom scanner.

[Series 9: 0-95% · axial · 0.39mm/px · z∈[+1701,+1776]mm · 5 of 2250 slices shown]
[im 375/2250  vessel]
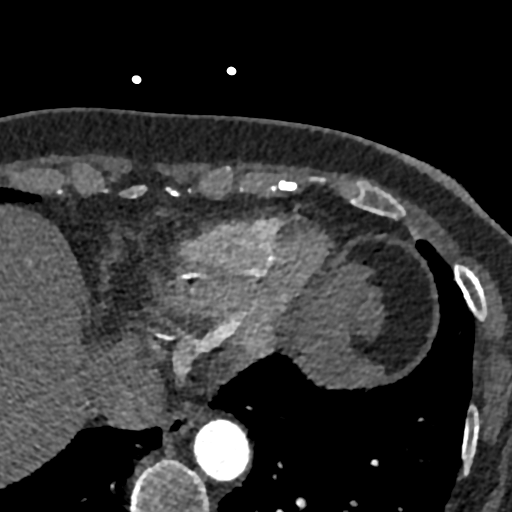
[im 750/2250  vessel]
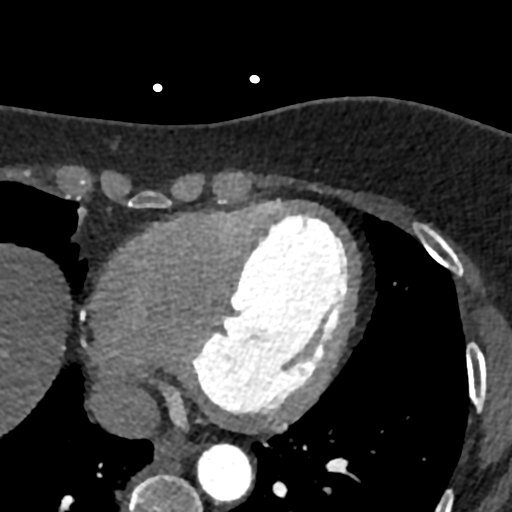
[im 1125/2250  vessel]
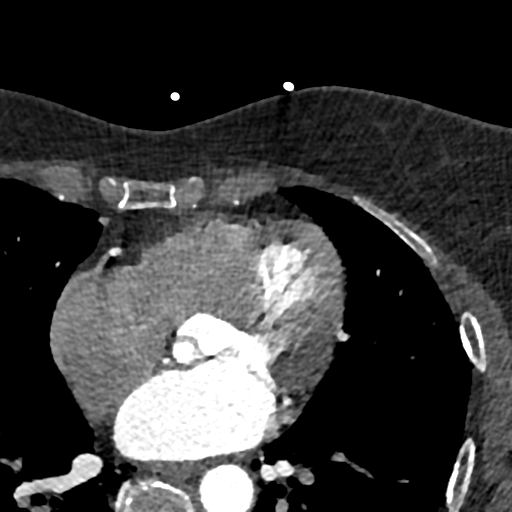
[im 1500/2250  vessel]
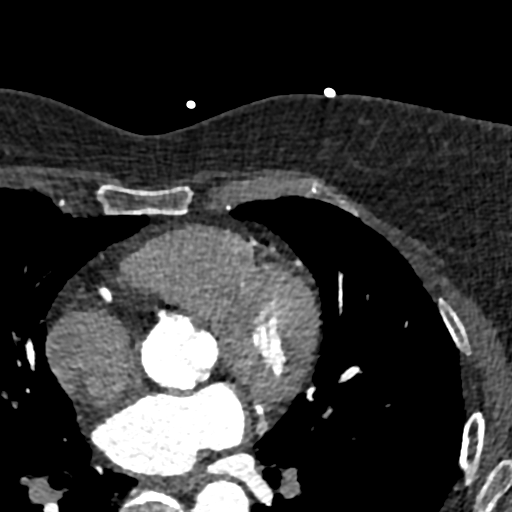
[im 1875/2250  vessel]
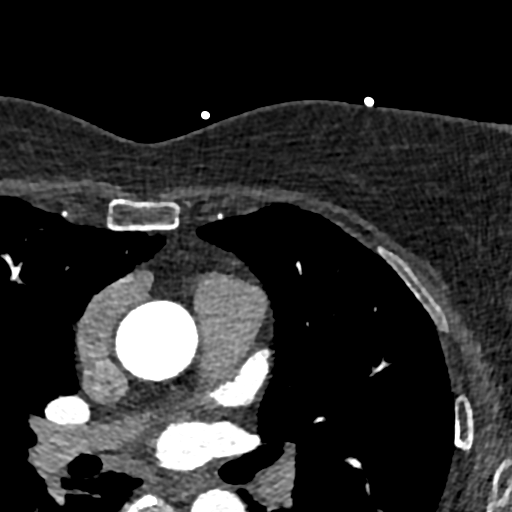

[Series 12: 5-95% · axial · 0.39mm/px · z∈[+1698,+1778]mm · 6 of 2250 slices shown, 8 images]
[im 322/2250  vessel]
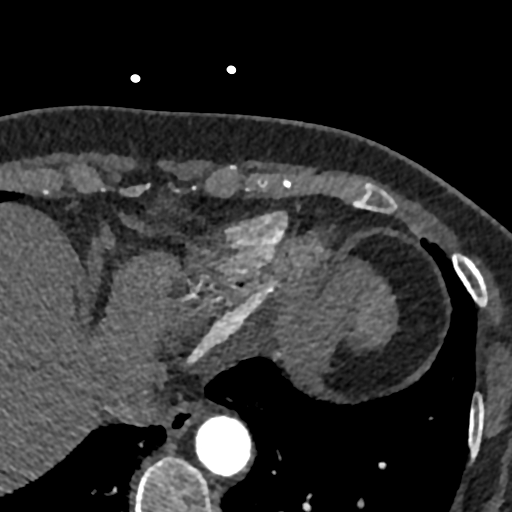
[im 322/2250  lung]
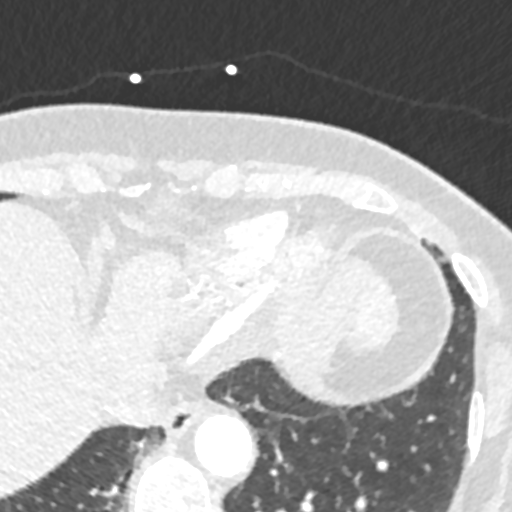
[im 643/2250  vessel]
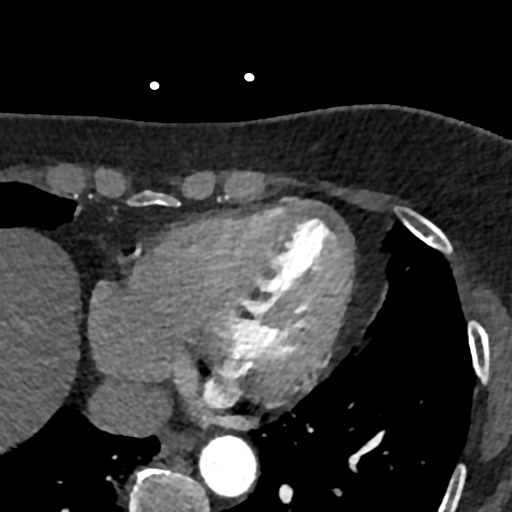
[im 964/2250  vessel]
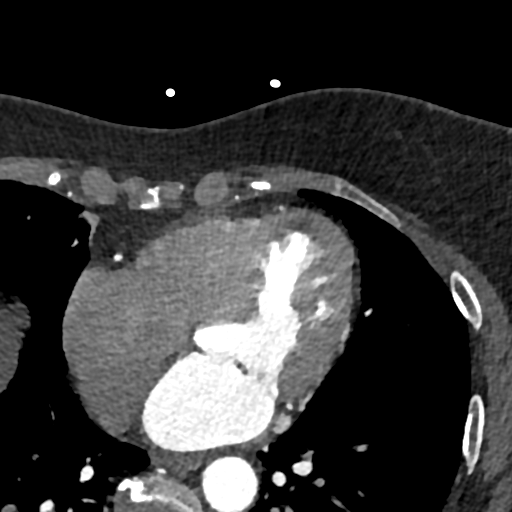
[im 1286/2250  vessel]
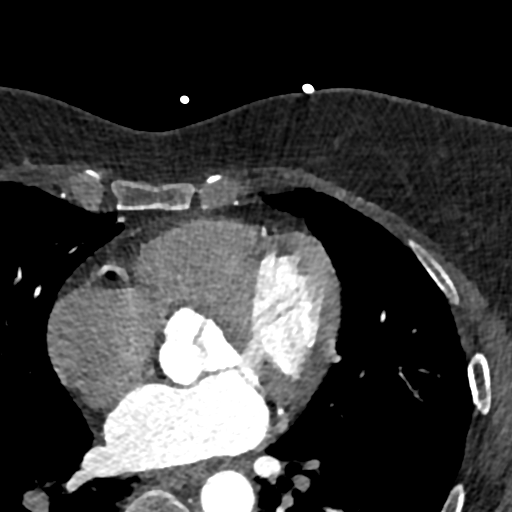
[im 1607/2250  vessel]
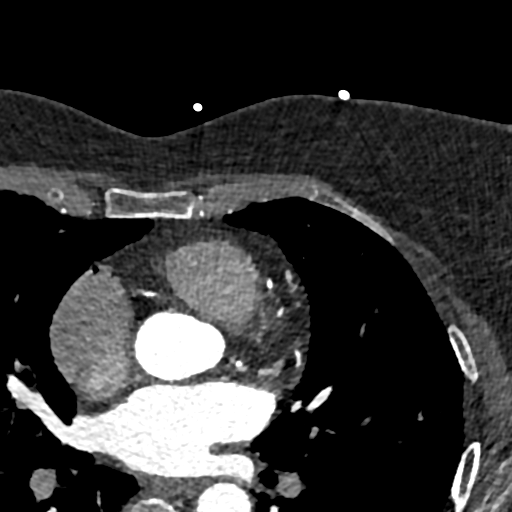
[im 1607/2250  lung]
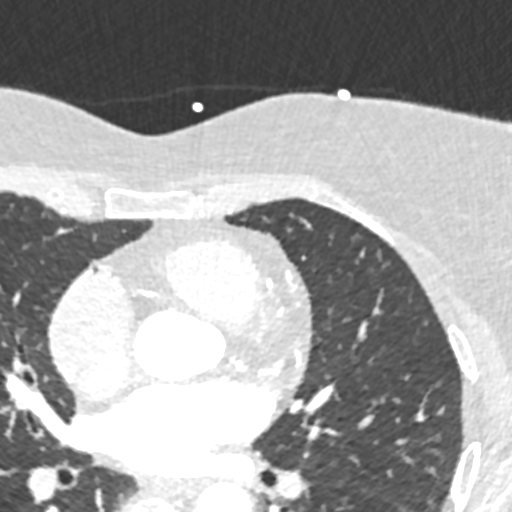
[im 1928/2250  vessel]
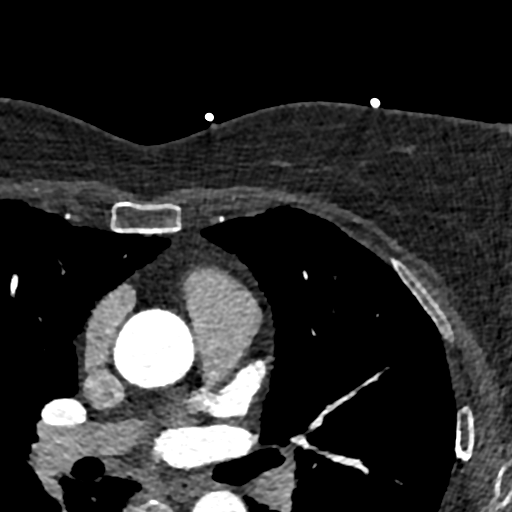

[11 of 20 positions shown; findings below may reference images not displayed]

FINDINGS: Aortic atherosclerosis. 4 mm right middle lobe pulmonary nodule
(axial image 5 of series 11). Within the visualized portions of the
thorax there are no other larger more suspicious appearing pulmonary
nodules or masses, there is no acute consolidative airspace disease,
no pleural effusions, no pneumothorax and no lymphadenopathy.
Visualized portions of the upper abdomen are unremarkable. There are
no aggressive appearing lytic or blastic lesions noted in the
visualized portions of the skeleton.
IMPRESSION: 1. 4 mm right middle lobe pulmonary nodule, nonspecific, but
statistically likely benign. No follow-up needed if patient is
low-risk. Non-contrast chest CT can be considered in 12 months if
patient is high-risk. This recommendation follows the consensus
statement: Guidelines for Management of Incidental Pulmonary Nodules
Detected on CT Images: From the [HOSPITAL] 3760; Radiology
2.  Aortic Atherosclerosis (PBSN3-DM6.6).
FINDINGS: A 120 kV prospective scan was triggered in the descending thoracic
aorta at 111 HU's. Gantry rotation speed was 280 msecs and
collimation was .9 mm. No beta blockade and no NTG was given. The 3D
data set was reconstructed in 5% intervals of the 60-80 % of the R-R
cycle. Diastolic phases were analyzed on a dedicated work station
using MPR, MIP and VRT modes. The patient received 80 cc of
contrast.

There is normal pulmonary vein drainage into the left atrium (2 on
the right and 2 on the left) with ostial measurements as follows:

RUPV: 18.0 x 14.2 mm

RLPV: 13.2 x 10.9 mm

LUPV: 19.8 x 16.4 mm

LLPV: 15.6 x 10.6 mm

The left atrial appendage is large chicken wing type with two lobes
and ostial size 21 x 26 mm and length 26 mm. There is no thrombus in
the left atrial appendage.

The esophagus runs in the left atrial midline and is not in the
proximity to any of the pulmonary veins.

Aorta: Normal caliber. Ascending aorta 3.1 cm. No dissection or
calcifications.

Aortic Valve:  Trileaflet.  No calcifications.

Coronary Arteries: Normal coronary origin. Right dominance. The
study was performed without use of NTG and insufficient for plaque
evaluation.
IMPRESSION: 1. There is normal pulmonary vein drainage into the left atrium.

2. The left atrial appendage is large chicken wing type with two
lobes and ostial size 21 x 26 mm and length 26 mm. There is no
thrombus in the left atrial appendage.

3. The esophagus runs in the left atrial midline and is not in the
proximity to any of the pulmonary veins.

*** End of Addendum ***
EXAM:
OVER-READ INTERPRETATION  CT CHEST

The following report is an over-read performed by radiologist Dr.
Tat Tiger [REDACTED] on 05/14/2020. This
over-read does not include interpretation of cardiac or coronary
anatomy or pathology. The coronary calcium score/coronary CTA
interpretation by the cardiologist is attached.
FINDINGS: Aortic atherosclerosis. 4 mm right middle lobe pulmonary nodule
(axial image 5 of series 11). Within the visualized portions of the
thorax there are no other larger more suspicious appearing pulmonary
nodules or masses, there is no acute consolidative airspace disease,
no pleural effusions, no pneumothorax and no lymphadenopathy.
Visualized portions of the upper abdomen are unremarkable. There are
no aggressive appearing lytic or blastic lesions noted in the
visualized portions of the skeleton.
IMPRESSION: 1. 4 mm right middle lobe pulmonary nodule, nonspecific, but
statistically likely benign. No follow-up needed if patient is
low-risk. Non-contrast chest CT can be considered in 12 months if
patient is high-risk. This recommendation follows the consensus
statement: Guidelines for Management of Incidental Pulmonary Nodules
Detected on CT Images: From the [HOSPITAL] 3760; Radiology
2.  Aortic Atherosclerosis (PBSN3-DM6.6).

## 2022-05-14 DIAGNOSIS — Z23 Encounter for immunization: Secondary | ICD-10-CM | POA: Diagnosis not present

## 2022-05-27 DIAGNOSIS — R39198 Other difficulties with micturition: Secondary | ICD-10-CM | POA: Diagnosis not present

## 2022-05-27 DIAGNOSIS — I700 Atherosclerosis of aorta: Secondary | ICD-10-CM | POA: Diagnosis not present

## 2022-05-27 DIAGNOSIS — N3000 Acute cystitis without hematuria: Secondary | ICD-10-CM | POA: Diagnosis not present

## 2022-05-27 DIAGNOSIS — B3731 Acute candidiasis of vulva and vagina: Secondary | ICD-10-CM | POA: Diagnosis not present

## 2022-08-14 ENCOUNTER — Encounter (HOSPITAL_COMMUNITY): Payer: Self-pay | Admitting: *Deleted

## 2022-11-05 DIAGNOSIS — Z1389 Encounter for screening for other disorder: Secondary | ICD-10-CM | POA: Diagnosis not present

## 2022-11-05 DIAGNOSIS — E663 Overweight: Secondary | ICD-10-CM | POA: Diagnosis not present

## 2022-11-05 DIAGNOSIS — Z6828 Body mass index (BMI) 28.0-28.9, adult: Secondary | ICD-10-CM | POA: Diagnosis not present

## 2022-11-05 DIAGNOSIS — Z0000 Encounter for general adult medical examination without abnormal findings: Secondary | ICD-10-CM | POA: Diagnosis not present

## 2022-11-05 DIAGNOSIS — Z79899 Other long term (current) drug therapy: Secondary | ICD-10-CM | POA: Diagnosis not present

## 2023-01-26 DIAGNOSIS — R1031 Right lower quadrant pain: Secondary | ICD-10-CM | POA: Diagnosis not present

## 2023-02-09 ENCOUNTER — Ambulatory Visit (HOSPITAL_COMMUNITY): Payer: Medicare Other

## 2023-02-12 ENCOUNTER — Other Ambulatory Visit (HOSPITAL_COMMUNITY): Payer: Self-pay

## 2023-02-12 DIAGNOSIS — I739 Peripheral vascular disease, unspecified: Secondary | ICD-10-CM

## 2023-02-13 ENCOUNTER — Ambulatory Visit (HOSPITAL_COMMUNITY)
Admission: RE | Admit: 2023-02-13 | Discharge: 2023-02-13 | Disposition: A | Discharge status: Home / Self Care | Payer: Medicare Other | Source: Ambulatory Visit | Attending: Vascular Surgery | Admitting: Vascular Surgery

## 2023-02-13 ENCOUNTER — Other Ambulatory Visit (HOSPITAL_COMMUNITY): Payer: Self-pay | Admitting: Vascular Surgery

## 2023-02-13 DIAGNOSIS — I714 Abdominal aortic aneurysm, without rupture, unspecified: Secondary | ICD-10-CM

## 2023-02-13 DIAGNOSIS — I739 Peripheral vascular disease, unspecified: Secondary | ICD-10-CM

## 2023-02-25 ENCOUNTER — Encounter (HOSPITAL_COMMUNITY): Payer: Self-pay | Admitting: Cardiology

## 2023-04-17 ENCOUNTER — Other Ambulatory Visit: Payer: Self-pay | Admitting: Family Medicine

## 2023-04-17 DIAGNOSIS — Z1231 Encounter for screening mammogram for malignant neoplasm of breast: Secondary | ICD-10-CM

## 2023-04-24 ENCOUNTER — Ambulatory Visit
Admission: RE | Admit: 2023-04-24 | Discharge: 2023-04-24 | Disposition: A | Discharge status: Home / Self Care | Payer: Medicare Other | Source: Ambulatory Visit | Attending: Family Medicine | Admitting: Family Medicine

## 2023-04-24 DIAGNOSIS — Z23 Encounter for immunization: Secondary | ICD-10-CM | POA: Diagnosis not present

## 2023-04-24 DIAGNOSIS — Z1231 Encounter for screening mammogram for malignant neoplasm of breast: Secondary | ICD-10-CM | POA: Diagnosis not present

## 2023-07-02 DIAGNOSIS — J019 Acute sinusitis, unspecified: Secondary | ICD-10-CM | POA: Diagnosis not present

## 2024-02-22 DIAGNOSIS — I700 Atherosclerosis of aorta: Secondary | ICD-10-CM | POA: Diagnosis not present

## 2024-02-22 DIAGNOSIS — Z0000 Encounter for general adult medical examination without abnormal findings: Secondary | ICD-10-CM | POA: Diagnosis not present

## 2024-02-22 DIAGNOSIS — Z1389 Encounter for screening for other disorder: Secondary | ICD-10-CM | POA: Diagnosis not present

## 2024-02-22 DIAGNOSIS — E7800 Pure hypercholesterolemia, unspecified: Secondary | ICD-10-CM | POA: Diagnosis not present

## 2024-02-22 DIAGNOSIS — Z1159 Encounter for screening for other viral diseases: Secondary | ICD-10-CM | POA: Diagnosis not present

## 2024-03-29 ENCOUNTER — Other Ambulatory Visit: Payer: Self-pay | Admitting: Family Medicine

## 2024-03-29 DIAGNOSIS — Z0000 Encounter for general adult medical examination without abnormal findings: Secondary | ICD-10-CM

## 2024-05-18 ENCOUNTER — Ambulatory Visit

## 2024-05-19 ENCOUNTER — Encounter

## 2024-05-30 ENCOUNTER — Other Ambulatory Visit: Payer: Self-pay | Admitting: Family Medicine

## 2024-05-30 DIAGNOSIS — Z1231 Encounter for screening mammogram for malignant neoplasm of breast: Secondary | ICD-10-CM

## 2024-05-31 ENCOUNTER — Ambulatory Visit
Admission: RE | Admit: 2024-05-31 | Discharge: 2024-05-31 | Disposition: A | Discharge status: Home / Self Care | Source: Ambulatory Visit | Attending: Family Medicine | Admitting: Family Medicine

## 2024-05-31 DIAGNOSIS — Z1231 Encounter for screening mammogram for malignant neoplasm of breast: Secondary | ICD-10-CM

## 2024-06-17 ENCOUNTER — Encounter
